# Patient Record
Sex: Female | Born: 1965 | Race: Black or African American | Hispanic: No | Smoking: Former smoker
Health system: Southern US, Community
[De-identification: ages and names within clinical notes are randomized; demographics above are authoritative.]

## PROBLEM LIST (undated history)

## (undated) DIAGNOSIS — F410 Panic disorder [episodic paroxysmal anxiety] without agoraphobia: Secondary | ICD-10-CM

## (undated) DIAGNOSIS — M25569 Pain in unspecified knee: Secondary | ICD-10-CM

## (undated) DIAGNOSIS — E669 Obesity, unspecified: Secondary | ICD-10-CM

## (undated) HISTORY — PX: PARTIAL HYSTERECTOMY: SHX80

## (undated) HISTORY — PX: SHOULDER SURGERY: SHX246

---

## 1999-03-08 ENCOUNTER — Emergency Department (HOSPITAL_COMMUNITY): Admission: EM | Admit: 1999-03-08 | Discharge: 1999-03-08 | Payer: Self-pay | Admitting: *Deleted

## 1999-03-08 ENCOUNTER — Encounter: Payer: Self-pay | Admitting: Emergency Medicine

## 1999-04-14 ENCOUNTER — Encounter: Payer: Self-pay | Admitting: Family Medicine

## 1999-04-14 ENCOUNTER — Ambulatory Visit (HOSPITAL_COMMUNITY): Admission: RE | Admit: 1999-04-14 | Discharge: 1999-04-14 | Payer: Self-pay

## 1999-11-13 ENCOUNTER — Emergency Department (HOSPITAL_COMMUNITY): Admission: EM | Admit: 1999-11-13 | Discharge: 1999-11-13 | Payer: Self-pay | Admitting: Emergency Medicine

## 1999-12-17 ENCOUNTER — Emergency Department (HOSPITAL_COMMUNITY): Admission: EM | Admit: 1999-12-17 | Discharge: 1999-12-17 | Payer: Self-pay | Admitting: Emergency Medicine

## 2001-02-12 ENCOUNTER — Emergency Department (HOSPITAL_COMMUNITY): Admission: EM | Admit: 2001-02-12 | Discharge: 2001-02-12 | Payer: Self-pay

## 2001-05-01 ENCOUNTER — Emergency Department (HOSPITAL_COMMUNITY): Admission: EM | Admit: 2001-05-01 | Discharge: 2001-05-01 | Payer: Self-pay | Admitting: Emergency Medicine

## 2001-05-09 ENCOUNTER — Emergency Department (HOSPITAL_COMMUNITY): Admission: EM | Admit: 2001-05-09 | Discharge: 2001-05-09 | Payer: Self-pay | Admitting: Emergency Medicine

## 2001-09-15 ENCOUNTER — Emergency Department (HOSPITAL_COMMUNITY): Admission: EM | Admit: 2001-09-15 | Discharge: 2001-09-15 | Payer: Self-pay | Admitting: Emergency Medicine

## 2001-09-15 ENCOUNTER — Encounter: Payer: Self-pay | Admitting: Emergency Medicine

## 2003-04-18 ENCOUNTER — Emergency Department (HOSPITAL_COMMUNITY): Admission: EM | Admit: 2003-04-18 | Discharge: 2003-04-18 | Payer: Self-pay | Admitting: Emergency Medicine

## 2003-06-15 ENCOUNTER — Emergency Department (HOSPITAL_COMMUNITY): Admission: EM | Admit: 2003-06-15 | Discharge: 2003-06-15 | Payer: Self-pay | Admitting: Emergency Medicine

## 2003-06-15 ENCOUNTER — Encounter: Payer: Self-pay | Admitting: Emergency Medicine

## 2004-10-24 ENCOUNTER — Emergency Department (HOSPITAL_COMMUNITY): Admission: EM | Admit: 2004-10-24 | Discharge: 2004-10-24 | Payer: Self-pay | Admitting: Emergency Medicine

## 2004-10-27 ENCOUNTER — Emergency Department (HOSPITAL_COMMUNITY): Admission: EM | Admit: 2004-10-27 | Discharge: 2004-10-27 | Payer: Self-pay | Admitting: Emergency Medicine

## 2006-01-01 ENCOUNTER — Emergency Department (HOSPITAL_COMMUNITY): Admission: EM | Admit: 2006-01-01 | Discharge: 2006-01-01 | Payer: Self-pay | Admitting: Emergency Medicine

## 2006-01-15 ENCOUNTER — Emergency Department (HOSPITAL_COMMUNITY): Admission: EM | Admit: 2006-01-15 | Discharge: 2006-01-15 | Payer: Self-pay | Admitting: Emergency Medicine

## 2006-05-07 ENCOUNTER — Emergency Department (HOSPITAL_COMMUNITY): Admission: EM | Admit: 2006-05-07 | Discharge: 2006-05-07 | Payer: Self-pay | Admitting: Emergency Medicine

## 2006-08-26 ENCOUNTER — Emergency Department (HOSPITAL_COMMUNITY): Admission: EM | Admit: 2006-08-26 | Discharge: 2006-08-26 | Payer: Self-pay | Admitting: Emergency Medicine

## 2006-09-07 ENCOUNTER — Emergency Department (HOSPITAL_COMMUNITY): Admission: EM | Admit: 2006-09-07 | Discharge: 2006-09-08 | Payer: Self-pay | Admitting: Emergency Medicine

## 2007-02-22 ENCOUNTER — Emergency Department (HOSPITAL_COMMUNITY): Admission: EM | Admit: 2007-02-22 | Discharge: 2007-02-22 | Payer: Self-pay | Admitting: Emergency Medicine

## 2008-05-08 ENCOUNTER — Emergency Department (HOSPITAL_COMMUNITY): Admission: EM | Admit: 2008-05-08 | Discharge: 2008-05-08 | Payer: Self-pay | Admitting: Emergency Medicine

## 2008-06-05 ENCOUNTER — Emergency Department (HOSPITAL_COMMUNITY): Admission: EM | Admit: 2008-06-05 | Discharge: 2008-06-05 | Payer: Self-pay | Admitting: Emergency Medicine

## 2008-06-08 ENCOUNTER — Emergency Department (HOSPITAL_COMMUNITY): Admission: EM | Admit: 2008-06-08 | Discharge: 2008-06-08 | Payer: Self-pay | Admitting: Emergency Medicine

## 2008-06-10 ENCOUNTER — Emergency Department (HOSPITAL_COMMUNITY): Admission: EM | Admit: 2008-06-10 | Discharge: 2008-06-10 | Payer: Self-pay | Admitting: Emergency Medicine

## 2010-05-03 ENCOUNTER — Emergency Department (HOSPITAL_COMMUNITY): Admission: EM | Admit: 2010-05-03 | Discharge: 2010-05-03 | Payer: Self-pay | Admitting: Emergency Medicine

## 2010-11-13 LAB — CBC
HCT: 36.9 % (ref 36.0–46.0)
MCH: 29.9 pg (ref 26.0–34.0)
MCV: 88.2 fL (ref 78.0–100.0)
RBC: 4.18 MIL/uL (ref 3.87–5.11)
RDW: 13.8 % (ref 11.5–15.5)

## 2010-11-13 LAB — BASIC METABOLIC PANEL
BUN: 10 mg/dL (ref 6–23)
CO2: 26 mEq/L (ref 19–32)
Chloride: 106 mEq/L (ref 96–112)
Creatinine, Ser: 0.7 mg/dL (ref 0.4–1.2)
Potassium: 3.6 mEq/L (ref 3.5–5.1)

## 2010-11-13 LAB — D-DIMER, QUANTITATIVE: D-Dimer, Quant: 0.29 ug/mL-FEU (ref 0.00–0.48)

## 2010-11-13 LAB — DIFFERENTIAL
Basophils Absolute: 0 10*3/uL (ref 0.0–0.1)
Eosinophils Absolute: 0 10*3/uL (ref 0.0–0.7)
Monocytes Absolute: 0.3 10*3/uL (ref 0.1–1.0)
Monocytes Relative: 4 % (ref 3–12)

## 2010-11-13 LAB — POCT CARDIAC MARKERS

## 2011-01-05 ENCOUNTER — Emergency Department (HOSPITAL_COMMUNITY)
Admission: EM | Admit: 2011-01-05 | Discharge: 2011-01-05 | Disposition: A | Discharge status: Home / Self Care | Payer: Self-pay | Attending: Emergency Medicine | Admitting: Emergency Medicine

## 2011-01-05 ENCOUNTER — Emergency Department (HOSPITAL_COMMUNITY): Payer: Self-pay

## 2011-01-05 DIAGNOSIS — M25569 Pain in unspecified knee: Secondary | ICD-10-CM | POA: Insufficient documentation

## 2011-06-25 ENCOUNTER — Emergency Department (HOSPITAL_COMMUNITY)
Admission: EM | Admit: 2011-06-25 | Discharge: 2011-06-25 | Disposition: A | Discharge status: Home / Self Care | Payer: Self-pay | Attending: Emergency Medicine | Admitting: Emergency Medicine

## 2011-06-25 ENCOUNTER — Emergency Department (HOSPITAL_COMMUNITY): Payer: Self-pay

## 2011-06-25 DIAGNOSIS — Z87891 Personal history of nicotine dependence: Secondary | ICD-10-CM | POA: Insufficient documentation

## 2011-06-25 DIAGNOSIS — R070 Pain in throat: Secondary | ICD-10-CM | POA: Insufficient documentation

## 2011-06-25 DIAGNOSIS — J3489 Other specified disorders of nose and nasal sinuses: Secondary | ICD-10-CM | POA: Insufficient documentation

## 2011-06-25 DIAGNOSIS — R05 Cough: Secondary | ICD-10-CM | POA: Insufficient documentation

## 2011-06-25 DIAGNOSIS — B9789 Other viral agents as the cause of diseases classified elsewhere: Secondary | ICD-10-CM | POA: Insufficient documentation

## 2011-06-25 DIAGNOSIS — IMO0001 Reserved for inherently not codable concepts without codable children: Secondary | ICD-10-CM | POA: Insufficient documentation

## 2011-06-25 DIAGNOSIS — J069 Acute upper respiratory infection, unspecified: Secondary | ICD-10-CM | POA: Insufficient documentation

## 2011-06-25 DIAGNOSIS — R059 Cough, unspecified: Secondary | ICD-10-CM | POA: Insufficient documentation

## 2012-05-06 ENCOUNTER — Encounter (HOSPITAL_COMMUNITY): Payer: Self-pay

## 2012-05-06 ENCOUNTER — Emergency Department (HOSPITAL_COMMUNITY)
Admission: EM | Admit: 2012-05-06 | Discharge: 2012-05-06 | Disposition: A | Discharge status: Home / Self Care | Payer: Self-pay | Attending: Emergency Medicine | Admitting: Emergency Medicine

## 2012-05-06 DIAGNOSIS — L02219 Cutaneous abscess of trunk, unspecified: Secondary | ICD-10-CM | POA: Insufficient documentation

## 2012-05-06 DIAGNOSIS — L02212 Cutaneous abscess of back [any part, except buttock]: Secondary | ICD-10-CM

## 2012-05-06 DIAGNOSIS — F410 Panic disorder [episodic paroxysmal anxiety] without agoraphobia: Secondary | ICD-10-CM | POA: Insufficient documentation

## 2012-05-06 DIAGNOSIS — Z87891 Personal history of nicotine dependence: Secondary | ICD-10-CM | POA: Insufficient documentation

## 2012-05-06 HISTORY — DX: Obesity, unspecified: E66.9

## 2012-05-06 HISTORY — DX: Panic disorder (episodic paroxysmal anxiety): F41.0

## 2012-05-06 MED ORDER — SULFAMETHOXAZOLE-TRIMETHOPRIM 800-160 MG PO TABS: 1.0000 | ORAL_TABLET | Freq: Two times a day (BID) | ORAL | Status: AC

## 2012-05-06 MED ORDER — TRAMADOL-ACETAMINOPHEN 37.5-325 MG PO TABS: ORAL_TABLET | ORAL | Status: AC

## 2012-05-06 NOTE — ED Provider Notes (Cosign Needed)
History     CSN: [id number]  Arrival date & time 05/06/12  0820   First MD Initiated Contact with Patient 05/06/12 0930      Chief Complaint  Patient presents with  . Abscess    recurring.    (Consider location/radiation/quality/duration/timing/severity/associated sxs/prior treatment) HPI  Pt relates she has had an abscess in the same place about 3-4 times in the past, the last time was 1 year ago. States it is aching and started draining yesterday. Denies fever.   PCP none  Past Medical History  Diagnosis Date  . Obesity   . Panic attacks     Past Surgical History  Procedure Date  . Partial hysterectomy     Family History  Problem Relation Age of Onset  . Osteoarthritis Mother   . Hypertension Father   . Hypertension Sister     History  Substance Use Topics  . Smoking status: Former Games developer  . Smokeless tobacco: Never Used  . Alcohol Use: No  employed Lives alone  OB History    Grav Para Term Preterm Abortions TAB SAB Ect Mult Living                  Review of Systems  All other systems reviewed and are negative.    Allergies  Ibuprofen  Home Medications   Current Outpatient Rx  Name Route Sig Dispense Refill  . ASPIRIN 81 MG PO CHEW Oral Chew 162 mg by mouth daily as needed. For pain.      BP 145/72  Pulse 75  Temp 98.6 F (37 C) (Oral)  Resp 18  SpO2 100%  Vital signs normal    Physical Exam  Vitals reviewed. Constitutional: She is oriented to person, place, and time. She appears well-developed and well-nourished.  Non-toxic appearance. She does not appear ill. No distress.  HENT:  Head: Normocephalic and atraumatic.  Nose: Nose normal. No mucosal edema or rhinorrhea.  Mouth/Throat: Mucous membranes are normal. No dental abscesses or uvula swelling.  Eyes: Conjunctivae and EOM are normal. Pupils are equal, round, and reactive to light.  Neck: Normal range of motion and full passive range of motion without pain. Neck supple.    Pulmonary/Chest: Effort normal. No respiratory distress. She has no rhonchi. She exhibits no crepitus.  Abdominal: Normal appearance.  Musculoskeletal: Normal range of motion. She exhibits no edema and no tenderness.       Moves all extremities well.   Neurological: She is alert and oriented to person, place, and time. She has normal strength. No cranial nerve deficit.  Skin: Skin is warm, dry and intact. No rash noted. No erythema. No pallor.       Pt has a darkened area that is oval and about 2 cm in horizontal length with an area in the center that look like granulation tissue. No induration under the area, when palpated a small amount of purulent material is expressed. No erythema or warmth noted.  Psychiatric: She has a normal mood and affect. Her speech is normal and behavior is normal. Her mood appears not anxious.    ED Course  Procedures (including critical care time)  The abscess does not need I and D at this time, no induration under the area and it is already spontaneously draining. I wonder if she has a cyst that needs to be removed since this is about the 4th episode of similar symptoms in same place. Will advise to see dermatologist once better to see if she needs  a cystectomy done.     1. Abscess of back     New Prescriptions   SULFAMETHOXAZOLE-TRIMETHOPRIM (SEPTRA DS) 800-160 MG PER TABLET    Take 1 tablet by mouth 2 (two) times daily.   TRAMADOL-ACETAMINOPHEN (ULTRACET) 37.5-325 MG PER TABLET    2 tabs po QID prn pain    Plan discharge  Devoria Albe, MD, Armando Gang   MDM          Ward Givens, MD 05/06/12 1012

## 2012-05-06 NOTE — ED Notes (Signed)
Pataient reports that abscess to left mid back started 4 years ago and continues to reoccur. Patient reports that she has had 4 incision and drainage. Area currently draining a small amount of serosangious drainage.

## 2012-05-21 ENCOUNTER — Encounter (HOSPITAL_COMMUNITY): Payer: Self-pay | Admitting: Emergency Medicine

## 2012-05-21 ENCOUNTER — Emergency Department (HOSPITAL_COMMUNITY)
Admission: EM | Admit: 2012-05-21 | Discharge: 2012-05-21 | Disposition: A | Discharge status: Home / Self Care | Payer: No Typology Code available for payment source | Attending: Emergency Medicine | Admitting: Emergency Medicine

## 2012-05-21 DIAGNOSIS — S139XXA Sprain of joints and ligaments of unspecified parts of neck, initial encounter: Secondary | ICD-10-CM | POA: Insufficient documentation

## 2012-05-21 DIAGNOSIS — T148XXA Other injury of unspecified body region, initial encounter: Secondary | ICD-10-CM

## 2012-05-21 DIAGNOSIS — Y93I9 Activity, other involving external motion: Secondary | ICD-10-CM | POA: Insufficient documentation

## 2012-05-21 DIAGNOSIS — Y998 Other external cause status: Secondary | ICD-10-CM | POA: Insufficient documentation

## 2012-05-21 DIAGNOSIS — Z87891 Personal history of nicotine dependence: Secondary | ICD-10-CM | POA: Insufficient documentation

## 2012-05-21 DIAGNOSIS — S161XXA Strain of muscle, fascia and tendon at neck level, initial encounter: Secondary | ICD-10-CM

## 2012-05-21 DIAGNOSIS — E669 Obesity, unspecified: Secondary | ICD-10-CM | POA: Insufficient documentation

## 2012-05-21 DIAGNOSIS — T07XXXA Unspecified multiple injuries, initial encounter: Secondary | ICD-10-CM | POA: Insufficient documentation

## 2012-05-21 MED ORDER — CYCLOBENZAPRINE HCL 10 MG PO TABS: 10.0000 mg | ORAL_TABLET | Freq: Two times a day (BID) | ORAL | Status: DC | PRN

## 2012-05-21 MED ORDER — ACETAMINOPHEN 325 MG PO TABS: 650.0000 mg | ORAL_TABLET | Freq: Four times a day (QID) | ORAL | Status: DC | PRN

## 2012-05-21 NOTE — ED Provider Notes (Signed)
Medical screening examination/treatment/procedure(s) were performed by non-physician practitioner and as supervising physician I was immediately available for consultation/collaboration.    Celene Kras, MD 05/21/12 [phone]
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
## 2013-03-22 ENCOUNTER — Emergency Department (HOSPITAL_COMMUNITY)
Admission: EM | Admit: 2013-03-22 | Discharge: 2013-03-22 | Disposition: A | Discharge status: Home / Self Care | Payer: No Typology Code available for payment source | Attending: Emergency Medicine | Admitting: Emergency Medicine

## 2013-03-22 ENCOUNTER — Encounter (HOSPITAL_COMMUNITY): Payer: Self-pay | Admitting: *Deleted

## 2013-03-22 DIAGNOSIS — Z87891 Personal history of nicotine dependence: Secondary | ICD-10-CM | POA: Insufficient documentation

## 2013-03-22 DIAGNOSIS — L02212 Cutaneous abscess of back [any part, except buttock]: Secondary | ICD-10-CM

## 2013-03-22 DIAGNOSIS — W57XXXA Bitten or stung by nonvenomous insect and other nonvenomous arthropods, initial encounter: Secondary | ICD-10-CM | POA: Insufficient documentation

## 2013-03-22 DIAGNOSIS — Z8659 Personal history of other mental and behavioral disorders: Secondary | ICD-10-CM | POA: Insufficient documentation

## 2013-03-22 DIAGNOSIS — L089 Local infection of the skin and subcutaneous tissue, unspecified: Secondary | ICD-10-CM | POA: Insufficient documentation

## 2013-03-22 DIAGNOSIS — L02219 Cutaneous abscess of trunk, unspecified: Secondary | ICD-10-CM | POA: Insufficient documentation

## 2013-03-22 DIAGNOSIS — Y929 Unspecified place or not applicable: Secondary | ICD-10-CM | POA: Insufficient documentation

## 2013-03-22 DIAGNOSIS — Y9389 Activity, other specified: Secondary | ICD-10-CM | POA: Insufficient documentation

## 2013-03-22 DIAGNOSIS — E669 Obesity, unspecified: Secondary | ICD-10-CM | POA: Insufficient documentation

## 2013-03-22 MED ORDER — MUPIROCIN CALCIUM 2 % EX CREA: TOPICAL_CREAM | Freq: Three times a day (TID) | CUTANEOUS | Status: DC

## 2013-03-22 MED ORDER — HYDROCODONE-ACETAMINOPHEN 5-325 MG PO TABS: 1.0000 | ORAL_TABLET | Freq: Four times a day (QID) | ORAL | Status: DC | PRN

## 2013-03-22 MED ORDER — CEPHALEXIN 500 MG PO CAPS: 500.0000 mg | ORAL_CAPSULE | Freq: Four times a day (QID) | ORAL | Status: DC

## 2013-03-22 NOTE — ED Notes (Signed)
Pt states 4 years ago she got bit by something on her upper back, states place keeps coming back and recently it has been bleeding everyday, pt states painful when anything touches the spot.

## 2013-03-22 NOTE — ED Provider Notes (Signed)
   History    CSN: [id number] Arrival date & time 03/22/13  1252  First MD Initiated Contact with Patient 03/22/13 1337     Chief Complaint  Patient presents with  . bleeding insect bite    (Consider location/radiation/quality/duration/timing/severity/associated sxs/prior Treatment) HPI Sara Arias is a 47 y.o. female who presents to ED with complaint of an abscess to the left upper back that is erythematous, recurrent for several years. States had to have it I&Ded several times. States this time pain for two days, started to drain and bleed at work over night. Pt denies fever, chills, malaise. Has not tried any medications. Has not seen anyone for it this time. Pain worsened with palpation and movement of the left arm.    Past Medical History  Diagnosis Date  . Obesity   . Panic attacks    Past Surgical History  Procedure Laterality Date  . Partial hysterectomy     Family History  Problem Relation Age of Onset  . Osteoarthritis Mother   . Hypertension Father   . Hypertension Sister    History  Substance Use Topics  . Smoking status: Former Games developer  . Smokeless tobacco: Never Used  . Alcohol Use: No   OB History   Grav Para Term Preterm Abortions TAB SAB Ect Mult Living                 Review of Systems  Constitutional: Negative for fever and chills.  HENT: Negative for neck pain and neck stiffness.   Respiratory: Negative.   Cardiovascular: Negative.   Genitourinary: Negative for dysuria and flank pain.  Skin: Positive for wound.  Neurological: Negative for weakness and numbness.    Allergies  Ibuprofen  Home Medications  No current outpatient prescriptions on file. BP 122/83  Pulse 83  Temp(Src) 97.7 F (36.5 C) (Oral)  Resp 18  SpO2 100% Physical Exam  Nursing note and vitals reviewed. Constitutional: She appears well-developed and well-nourished. No distress.  Cardiovascular: Normal rate, regular rhythm and normal heart sounds.   Pulmonary/Chest:  Effort normal. No respiratory distress. She has no wheezes. She has no rales.  Neurological: She is alert.  Skin: Skin is warm and dry.  There is opened abscess to the left scapular area. No drainage noted. No fluctuance, no swelling, no surrounding cellulitis. No bleeding. TTP.   Psychiatric: She has a normal mood and affect. Her behavior is normal.    ED Course  Procedures (including critical care time) Labs Reviewed - No data to display No results found.  1. Abscess of back     MDM  Pt with already opened abscess, draining on its own. No drainage or bleeding at present. There is no induration of fluctuance. I do not feel that there is an abscess to drain at this time. No surrounding cellulitis. Will treat with antibiotics. Follow up with pcp.   Filed Vitals:   03/22/13 1304  BP: 122/83  Pulse: 83  Temp: 97.7 F (36.5 C)  TempSrc: Oral  Resp: 18  SpO2: 100%     Myriam Jacobson Latana Colin, PA-C 03/22/13 1618

## 2013-03-23 NOTE — ED Provider Notes (Signed)
Medical screening examination/treatment/procedure(s) were performed by non-physician practitioner and as supervising physician I was immediately available for consultation/collaboration.   Lyanne Co, MD 03/23/13 1323

## 2013-05-12 ENCOUNTER — Emergency Department (HOSPITAL_COMMUNITY)
Admission: EM | Admit: 2013-05-12 | Discharge: 2013-05-12 | Disposition: A | Discharge status: Home / Self Care | Payer: No Typology Code available for payment source | Attending: Emergency Medicine | Admitting: Emergency Medicine

## 2013-05-12 ENCOUNTER — Emergency Department (HOSPITAL_COMMUNITY): Payer: No Typology Code available for payment source

## 2013-05-12 ENCOUNTER — Encounter (HOSPITAL_COMMUNITY): Payer: Self-pay | Admitting: Emergency Medicine

## 2013-05-12 DIAGNOSIS — Z8659 Personal history of other mental and behavioral disorders: Secondary | ICD-10-CM | POA: Insufficient documentation

## 2013-05-12 DIAGNOSIS — Z7982 Long term (current) use of aspirin: Secondary | ICD-10-CM | POA: Insufficient documentation

## 2013-05-12 DIAGNOSIS — R042 Hemoptysis: Secondary | ICD-10-CM | POA: Insufficient documentation

## 2013-05-12 DIAGNOSIS — R4789 Other speech disturbances: Secondary | ICD-10-CM | POA: Insufficient documentation

## 2013-05-12 DIAGNOSIS — R079 Chest pain, unspecified: Secondary | ICD-10-CM | POA: Insufficient documentation

## 2013-05-12 DIAGNOSIS — R05 Cough: Secondary | ICD-10-CM | POA: Insufficient documentation

## 2013-05-12 DIAGNOSIS — R059 Cough, unspecified: Secondary | ICD-10-CM | POA: Insufficient documentation

## 2013-05-12 DIAGNOSIS — Z87891 Personal history of nicotine dependence: Secondary | ICD-10-CM | POA: Insufficient documentation

## 2013-05-12 DIAGNOSIS — E669 Obesity, unspecified: Secondary | ICD-10-CM | POA: Insufficient documentation

## 2013-05-12 DIAGNOSIS — R509 Fever, unspecified: Secondary | ICD-10-CM | POA: Insufficient documentation

## 2013-05-12 DIAGNOSIS — J029 Acute pharyngitis, unspecified: Secondary | ICD-10-CM | POA: Insufficient documentation

## 2013-05-12 DIAGNOSIS — R112 Nausea with vomiting, unspecified: Secondary | ICD-10-CM | POA: Insufficient documentation

## 2013-05-12 DIAGNOSIS — R61 Generalized hyperhidrosis: Secondary | ICD-10-CM | POA: Insufficient documentation

## 2013-05-12 DIAGNOSIS — J3489 Other specified disorders of nose and nasal sinuses: Secondary | ICD-10-CM | POA: Insufficient documentation

## 2013-05-12 DIAGNOSIS — B9789 Other viral agents as the cause of diseases classified elsewhere: Secondary | ICD-10-CM | POA: Insufficient documentation

## 2013-05-12 DIAGNOSIS — B349 Viral infection, unspecified: Secondary | ICD-10-CM

## 2013-05-12 LAB — RAPID STREP SCREEN (MED CTR MEBANE ONLY): Streptococcus, Group A Screen (Direct): NEGATIVE

## 2013-05-12 MED ORDER — MAGIC MOUTHWASH W/LIDOCAINE: 10.0000 mL | Freq: Three times a day (TID) | ORAL | Status: DC | PRN

## 2013-05-12 NOTE — ED Notes (Signed)
Pt states that she has had sore throat, hoarse voice and cough.  Pt states that this morning she coughed up mucous there was bright red blood in it. She has also had chills the past couple of days.

## 2013-05-12 NOTE — ED Provider Notes (Signed)
CSN: [id number]     Arrival date & time 05/12/13  1128 History   First MD Initiated Contact with Patient 05/12/13 1136     Chief Complaint  Patient presents with  . Sore Throat  . Hoarse  . Hemoptysis   (Consider location/radiation/quality/duration/timing/severity/associated sxs/prior Treatment) HPI Pt is a 47yo female c/o gradually worsening sore throat, 7/10, worse with swallowing and coughing.  This morning she had 3 episodes of post-tussive emesis that continued mucous and "small amount" of bright red blood.  Pt also reports hot and cold chills, subjective fever.  States she does have a sick grandson at home and not sure if she is sick because of him.  Has tried alka-seltzer and vix vapor rub with minimal relief. Denies difficulty swallowing or breathing. Denies hx of asthma.  Past Medical History  Diagnosis Date  . Obesity   . Panic attacks    Past Surgical History  Procedure Laterality Date  . Partial hysterectomy     Family History  Problem Relation Age of Onset  . Osteoarthritis Mother   . Hypertension Father   . Hypertension Sister    History  Substance Use Topics  . Smoking status: Former Games developer  . Smokeless tobacco: Never Used  . Alcohol Use: No   OB History   Grav Para Term Preterm Abortions TAB SAB Ect Mult Living                 Review of Systems  Constitutional: Positive for fever ( subjective), chills and diaphoresis. Negative for appetite change and fatigue.  HENT: Positive for congestion, sore throat and voice change ( "hoarse voice"). Negative for mouth sores, trouble swallowing and sinus pressure.   Respiratory: Positive for cough. Negative for shortness of breath.   Cardiovascular: Positive for chest pain. Negative for palpitations and leg swelling.  Gastrointestinal: Positive for nausea and vomiting. Negative for abdominal pain, diarrhea and constipation.  All other systems reviewed and are negative.    Allergies  Ibuprofen  Home Medications    Current Outpatient Rx  Name  Route  Sig  Dispense  Refill  . aspirin 81 MG chewable tablet   Oral   Chew 81 mg by mouth 3 (three) times daily.         . Alum & Mag Hydroxide-Simeth (MAGIC MOUTHWASH W/LIDOCAINE) SOLN   Oral   Take 10 mLs by mouth 3 (three) times daily as needed.   100 mL   0    BP 130/97  Pulse 86  Temp(Src) 98.4 F (36.9 C) (Oral)  Resp 18  SpO2 99% Physical Exam  Nursing note and vitals reviewed. Constitutional: She appears well-developed and well-nourished. No distress.  Pt sitting in exam bed, NAD.   HENT:  Head: Normocephalic and atraumatic.  Right Ear: Hearing, tympanic membrane, external ear and ear canal normal.  Left Ear: Hearing, tympanic membrane, external ear and ear canal normal.  Nose: Mucosal edema present. No rhinorrhea.  Mouth/Throat: Uvula is midline and mucous membranes are normal. She does not have dentures. No oral lesions. No trismus in the jaw. Normal dentition. No dental abscesses, edematous, lacerations or dental caries. Posterior oropharyngeal edema and posterior oropharyngeal erythema present. No oropharyngeal exudate or tonsillar abscesses.  Eyes: Conjunctivae are normal. No scleral icterus.  Neck: Normal range of motion. Neck supple.  No nuchal rigidity or meningeal signs.   Cardiovascular: Normal rate, regular rhythm and normal heart sounds.   Pulmonary/Chest: Effort normal and breath sounds normal. No respiratory distress. She has  no wheezes. She has no rales. She exhibits no tenderness.  Lungs: CTAB. No respiratory distress, able to speak in full sentences w/o difficulty.  Abdominal: Soft. Bowel sounds are normal. She exhibits no distension and no mass. There is no tenderness. There is no rebound and no guarding.  Musculoskeletal: Normal range of motion.  Neurological: She is alert.  Skin: Skin is warm and dry. She is not diaphoretic.    ED Course  Procedures (including critical care time) Labs Review Labs Reviewed   RAPID STREP SCREEN  CULTURE, GROUP A STREP   Imaging Review Dg Chest 2 View  05/12/2013   CLINICAL DATA:  Short of breath  EXAM: CHEST  2 VIEW  COMPARISON:  06/25/2011  FINDINGS: The heart size and mediastinal contours are within normal limits. Both lungs are clear. The visualized skeletal structures are unremarkable.  IMPRESSION: No active cardiopulmonary disease.   Electronically Signed   By: Amie Portland   On: 05/12/2013 12:46    MDM   1. Pharyngitis with viral syndrome    Pt appears well, non-toxic, no respiratory distress. Vitals: unremarkable.  Rapid strep: negative. CXR: no active cardiopulmonary disease.  Will tx pt symptomatically for viral pharyngitis.  Rx: magic mouthwash.  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  Provided work note.  Will discharge pt home and have her f/u with [facility] and [facility] info provided. Return precautions given. Pt verbalized understanding and agreement with tx plan. Vitals: unremarkable. Discharged in stable condition.         Junius Finner, PA-C 05/12/13 1304

## 2013-05-13 NOTE — ED Provider Notes (Signed)
Medical screening examination/treatment/procedure(s) were performed by non-physician practitioner and as supervising physician I was immediately available for consultation/collaboration.   Shanna Cisco, MD 05/13/13 [phone]

## 2013-05-14 LAB — CULTURE, GROUP A STREP

## 2013-06-20 ENCOUNTER — Emergency Department (HOSPITAL_COMMUNITY)
Admission: EM | Admit: 2013-06-20 | Discharge: 2013-06-20 | Disposition: A | Discharge status: Home / Self Care | Payer: No Typology Code available for payment source | Attending: Emergency Medicine | Admitting: Emergency Medicine

## 2013-06-20 ENCOUNTER — Encounter (HOSPITAL_COMMUNITY): Payer: Self-pay | Admitting: Emergency Medicine

## 2013-06-20 ENCOUNTER — Emergency Department (HOSPITAL_COMMUNITY): Payer: No Typology Code available for payment source

## 2013-06-20 DIAGNOSIS — X500XXA Overexertion from strenuous movement or load, initial encounter: Secondary | ICD-10-CM | POA: Insufficient documentation

## 2013-06-20 DIAGNOSIS — Z87891 Personal history of nicotine dependence: Secondary | ICD-10-CM | POA: Insufficient documentation

## 2013-06-20 DIAGNOSIS — IMO0002 Reserved for concepts with insufficient information to code with codable children: Secondary | ICD-10-CM | POA: Insufficient documentation

## 2013-06-20 DIAGNOSIS — E669 Obesity, unspecified: Secondary | ICD-10-CM | POA: Insufficient documentation

## 2013-06-20 DIAGNOSIS — M549 Dorsalgia, unspecified: Secondary | ICD-10-CM

## 2013-06-20 DIAGNOSIS — Y9389 Activity, other specified: Secondary | ICD-10-CM | POA: Insufficient documentation

## 2013-06-20 DIAGNOSIS — Z7982 Long term (current) use of aspirin: Secondary | ICD-10-CM | POA: Insufficient documentation

## 2013-06-20 DIAGNOSIS — Y9210 Unspecified residential institution as the place of occurrence of the external cause: Secondary | ICD-10-CM | POA: Insufficient documentation

## 2013-06-20 DIAGNOSIS — S46911A Strain of unspecified muscle, fascia and tendon at shoulder and upper arm level, right arm, initial encounter: Secondary | ICD-10-CM

## 2013-06-20 DIAGNOSIS — Y990 Civilian activity done for income or pay: Secondary | ICD-10-CM | POA: Insufficient documentation

## 2013-06-20 DIAGNOSIS — Z8659 Personal history of other mental and behavioral disorders: Secondary | ICD-10-CM | POA: Insufficient documentation

## 2013-06-20 MED ORDER — PREDNISONE 50 MG PO TABS: 50.0000 mg | ORAL_TABLET | Freq: Every day | ORAL | Status: DC

## 2013-06-20 MED ORDER — HYDROCODONE-ACETAMINOPHEN 5-325 MG PO TABS: 1.0000 | ORAL_TABLET | Freq: Four times a day (QID) | ORAL | Status: DC | PRN

## 2013-06-20 MED ORDER — CYCLOBENZAPRINE HCL 10 MG PO TABS: 10.0000 mg | ORAL_TABLET | Freq: Three times a day (TID) | ORAL | Status: DC | PRN

## 2013-06-20 NOTE — ED Notes (Signed)
Pt c/o of right shoulder pain and back pain 10/10. 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

## 2013-06-20 NOTE — ED Provider Notes (Signed)
CSN: [id number]     Arrival date & time 06/20/13  1610 History   First MD Initiated Contact with Patient 06/20/13 1635     Chief Complaint  Patient presents with  . Back Pain  . Shoulder Pain   (Consider location/radiation/quality/duration/timing/severity/associated sxs/prior Treatment) HPI  Sara Arias is a 47 y.o. 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 She has been taking Aspirin, muscle relaxants, and pain patch from the grocery store that have not resolved her pain. She is unable to do her work or do as much as before the accident. She feels tingling in her elbow and down her arm at times. Her spine also has burning feeling at times. No dizziness, headaches, blurred vision. No weakness or numbness in her legs. No bowel or bladder incontinence.    Past Medical History  Diagnosis Date  . Obesity   . Panic attacks    Past Surgical History  Procedure Laterality Date  . Partial hysterectomy     Family History  Problem Relation Age of Onset  . Osteoarthritis Mother   . Hypertension Father   . Hypertension Sister    History  Substance Use Topics  . Smoking status: Former Games developer  . Smokeless tobacco: Never Used  . Alcohol Use: No   OB History   Grav Para Term Preterm Abortions TAB SAB Ect Mult Living                 Review of Systems  Allergies  Ibuprofen  Home Medications   Current Outpatient Rx  Name  Route  Sig  Dispense  Refill  . aspirin (BAYER ASPIRIN) 325 MG tablet   Oral   Take 325 mg by mouth daily.         . Menthol-Methyl Salicylate (MUSCLE RUB) 10-15 % CREA   Topical   Apply 1 application topically as needed (for shulder pain).          BP 117/80  Pulse 97  Temp(Src) 98.9 F (37.2 C) (Oral)  Resp 18  SpO2 100% Physical Exam  Constitutional: She is oriented to person, place, and time. She appears well-developed and well-nourished.  HENT:  Head:  Normocephalic and atraumatic.  Neck: Normal range of motion. Spinous process tenderness and muscular tenderness (on right side) present.  Cardiovascular: Normal rate, regular rhythm, S1 normal and S2 normal.   Pulmonary/Chest: Effort normal and breath sounds normal.  Musculoskeletal:       Right shoulder: She exhibits decreased range of motion, tenderness, bony tenderness and decreased strength. She exhibits no swelling, no crepitus and no deformity.       Left shoulder: Normal.       Right elbow: She exhibits normal range of motion (pain with pronation). Tenderness found. Lateral epicondyle tenderness noted.       Left elbow: Normal.       Right wrist: Normal. She exhibits normal range of motion, no tenderness and no bony tenderness.       Left wrist: Normal.       Cervical back: She exhibits tenderness and bony tenderness. She exhibits normal range of motion.       Thoracic back: She exhibits decreased range of motion, tenderness and bony tenderness. She exhibits no swelling.       Lumbar back: She exhibits decreased range of motion, tenderness and bony tenderness. She exhibits no swelling and no deformity.  Neurological: She is alert  and oriented to person, place, and time. She has normal strength and normal reflexes. She exhibits normal muscle tone. Coordination and gait normal.    ED Course  Procedures (including critical care time)  Patient has no neurological deficits noted on exam.  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  Patient be given orthopedic followup.  She told to return here as needed.  Told to use ice and heat on her shoulder and back  Carlyle Dolly, PA-C 06/20/13 1737

## 2013-06-20 NOTE — ED Provider Notes (Signed)
Medical screening examination/treatment/procedure(s) were performed by non-physician practitioner and as supervising physician I was immediately available for consultation/collaboration.  Shanna Cisco, MD 06/20/13 [phone]

## 2014-05-12 ENCOUNTER — Emergency Department (HOSPITAL_COMMUNITY): Payer: No Typology Code available for payment source

## 2014-05-12 ENCOUNTER — Emergency Department (HOSPITAL_COMMUNITY)
Admission: EM | Admit: 2014-05-12 | Discharge: 2014-05-12 | Disposition: A | Discharge status: Home / Self Care | Payer: No Typology Code available for payment source | Attending: Emergency Medicine | Admitting: Emergency Medicine

## 2014-05-12 ENCOUNTER — Encounter (HOSPITAL_COMMUNITY): Payer: Self-pay | Admitting: Emergency Medicine

## 2014-05-12 DIAGNOSIS — Y9389 Activity, other specified: Secondary | ICD-10-CM | POA: Insufficient documentation

## 2014-05-12 DIAGNOSIS — Y9289 Other specified places as the place of occurrence of the external cause: Secondary | ICD-10-CM | POA: Insufficient documentation

## 2014-05-12 DIAGNOSIS — S6980XA Other specified injuries of unspecified wrist, hand and finger(s), initial encounter: Secondary | ICD-10-CM | POA: Insufficient documentation

## 2014-05-12 DIAGNOSIS — E669 Obesity, unspecified: Secondary | ICD-10-CM | POA: Insufficient documentation

## 2014-05-12 DIAGNOSIS — S6000XA Contusion of unspecified finger without damage to nail, initial encounter: Secondary | ICD-10-CM | POA: Insufficient documentation

## 2014-05-12 DIAGNOSIS — X58XXXA Exposure to other specified factors, initial encounter: Secondary | ICD-10-CM | POA: Insufficient documentation

## 2014-05-12 DIAGNOSIS — Z8659 Personal history of other mental and behavioral disorders: Secondary | ICD-10-CM | POA: Insufficient documentation

## 2014-05-12 DIAGNOSIS — S6990XA Unspecified injury of unspecified wrist, hand and finger(s), initial encounter: Secondary | ICD-10-CM | POA: Insufficient documentation

## 2014-05-12 DIAGNOSIS — Z7982 Long term (current) use of aspirin: Secondary | ICD-10-CM | POA: Insufficient documentation

## 2014-05-12 DIAGNOSIS — Z87891 Personal history of nicotine dependence: Secondary | ICD-10-CM | POA: Insufficient documentation

## 2014-05-12 DIAGNOSIS — Z79899 Other long term (current) drug therapy: Secondary | ICD-10-CM | POA: Insufficient documentation

## 2014-05-12 DIAGNOSIS — S60021A Contusion of right index finger without damage to nail, initial encounter: Secondary | ICD-10-CM

## 2014-05-12 MED ORDER — TRAMADOL HCL 50 MG PO TABS
50.0000 mg | ORAL_TABLET | Freq: Once | ORAL | Status: AC
  Administered 2014-05-12: 50 mg via ORAL
  Filled 2014-05-12: qty 1

## 2014-05-12 MED ORDER — TRAMADOL HCL 50 MG PO TABS: 50.0000 mg | ORAL_TABLET | Freq: Four times a day (QID) | ORAL | Status: DC | PRN

## 2014-05-12 NOTE — ED Provider Notes (Signed)
CSN: [id number]     Arrival date & time 05/12/14  1247 History  This chart was scribed for non-physician practitioner, Francee Piccolo, PA-C working with Toy Cookey, MD by Greggory Stallion, ED scribe. This patient was seen in room WTR6/WTR6 and the patient's care was started at 2:13 PM.   Chief Complaint  Patient presents with  . Finger Injury   The history is provided by the patient. No language interpreter was used.   HPI Comments: YILIN WEEDON is a 48 y.o. female who presents to the Emergency Department complaining of right index finger injury that occurred one week ago. 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 Reports worsening right index finger pain with associated swelling. Pain does not radiate. Pt has taken bayer aspirin and used muscle rub with no relief. Denies past injury or surgery to right hand or fingers. Pt is left hand dominant.   Past Medical History  Diagnosis Date  . Obesity   . Panic attacks    Past Surgical History  Procedure Laterality Date  . Partial hysterectomy     Family History  Problem Relation Age of Onset  . Osteoarthritis Mother   . Hypertension Father   . Hypertension Sister    History  Substance Use Topics  . Smoking status: Former Smoker    Types: Cigarettes    Quit date: 05/01/2013  . Smokeless tobacco: Never Used  . Alcohol Use: No   OB History   Grav Para Term Preterm Abortions TAB SAB Ect Mult Living                 Review of Systems  Musculoskeletal: Positive for arthralgias and joint swelling.  All other systems reviewed and are negative.  Allergies  Ibuprofen  Home Medications   Prior to Admission medications   Medication Sig Start Date End Date Taking? Authorizing Provider  aspirin (BAYER ASPIRIN) 325 MG tablet Take 325 mg by mouth daily.   Yes Historical Provider, MD  Menthol-Methyl Salicylate (MUSCLE RUB) 10-15 % CREA Apply  1 application topically as needed (finger pain).    Yes Historical Provider, MD  Multiple Vitamins-Minerals (HAIR/SKIN/NAILS PO) Take 1 tablet by mouth 3 (three) times daily.   Yes Historical Provider, MD  traMADol (ULTRAM) 50 MG tablet Take 1 tablet (50 mg total) by mouth every 6 (six) hours as needed for severe pain. 05/12/14   Rucha Wissinger L Osman Calzadilla, PA-C   BP 147/100  Pulse 93  Temp(Src) 98.2 F (36.8 C) (Oral)  Resp 18  SpO2 99%  Physical Exam  Nursing note and vitals reviewed. Constitutional: She is oriented to person, place, and time. She appears well-developed and well-nourished. No distress.  HENT:  Head: Normocephalic and atraumatic.  Right Ear: External ear normal.  Left Ear: External ear normal.  Nose: Nose normal.  Mouth/Throat: Oropharynx is clear and moist.  Eyes: Conjunctivae are normal.  Neck: Normal range of motion. Neck supple.  Cardiovascular: Normal rate, regular rhythm, normal heart sounds and intact distal pulses.   Pulmonary/Chest: Effort normal and breath sounds normal. No respiratory distress. She has no wheezes. She has no rales.  Abdominal: Soft.  Musculoskeletal: Normal range of motion.       Right wrist: Normal.       Left wrist: Normal.       Left hand: Normal.       Hands: Neurological: She is alert and oriented to  person, place, and time.  Skin: Skin is warm and dry. She is not diaphoretic.  Psychiatric: She has a normal mood and affect.    ED Course  Procedures (including critical care time) Medications  traMADol (ULTRAM) tablet 50 mg (50 mg Oral Given 05/12/14 1443)    DIAGNOSTIC STUDIES: Oxygen Saturation is 100% on RA, normal by my interpretation.    COORDINATION OF CARE: 2:15 PM-Discussed treatment plan which includes xray with pt at bedside and pt agreed to plan.   Labs Review Labs Reviewed - No data to display  Imaging Review Dg Finger Index Right  05/12/2014   CLINICAL DATA:  Right index finger pain.  EXAM: RIGHT INDEX FINGER  2+V  COMPARISON:  No priors.  FINDINGS: Three views of the right second digit demonstrate no acute displaced fracture, subluxation or dislocation.  IMPRESSION: 1. No acute radiographic abnormality of the right index finger.   Electronically Signed   By: Trudie Reed M.D.   On: 05/12/2014 14:25     EKG Interpretation None      MDM   Final diagnoses:  Contusion of right index finger without damage to nail, initial encounter    Filed Vitals:   05/12/14 1444  BP:   Pulse: 93  Temp:   Resp: 18   Afebrile, NAD, non-toxic appearing, AAOx4. Neurovascularly intact. Normal sensation. Imaging shows no fracture. Directed pt to ice injury, take acetaminophen or ibuprofen for pain, and to elevate and rest the injury when possible. Patient is stable at time of discharge    I personally performed the services described in this documentation, which was scribed in my presence. The recorded information has been reviewed and is accurate.  Jeannetta Ellis, PA-C 05/12/14 1743

## 2014-05-12 NOTE — ED Notes (Signed)
Pt reports having having right index finger pain and swelling since last week. 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 Pt reports that the pain has gotten worse over the past week. Pt is A/O x4 and in NAD.

## 2014-05-12 NOTE — Discharge Instructions (Signed)
Your x-ray was negative for any broken bones. Please follow up with your primary care physician in 1-2 days. If you do not have one please call the [facility] number listed above. Please take pain medication and/or muscle relaxants as prescribed and as needed for pain. Please do not drive on narcotic pain medication or on muscle relaxants. Please follow RICE method below.    Contusion A contusion is a deep bruise. Contusions are the result of an injury that caused bleeding under the skin. The contusion may turn blue, purple, or yellow. Minor injuries will give you a painless contusion, but more severe contusions may stay painful and swollen for a few weeks.  CAUSES  A contusion is usually caused by a blow, trauma, or direct force to an area of the body. SYMPTOMS   Swelling and redness of the injured area.  Bruising of the injured area.  Tenderness and soreness of the injured area.  Pain. DIAGNOSIS  The diagnosis can be made by taking a history and physical exam. An X-ray, CT scan, or MRI may be needed to determine if there were any associated injuries, such as fractures. TREATMENT  Specific treatment will depend on what area of the body was injured. In general, the best treatment for a contusion is resting, icing, elevating, and applying cold compresses to the injured area. Over-the-counter medicines may also be recommended for pain control. Ask your caregiver what the best treatment is for your contusion. HOME CARE INSTRUCTIONS   Put ice on the injured area.  Put ice in a plastic bag.  Place a towel between your skin and the bag.  Leave the ice on for 15-20 minutes, 3-4 times a day, or as directed by your health care provider.  Only take over-the-counter or prescription medicines for pain, discomfort, or fever as directed by your caregiver. Your caregiver may recommend avoiding anti-inflammatory medicines (aspirin, ibuprofen, and naproxen) for 48 hours because  these medicines may increase bruising.  Rest the injured area.  If possible, elevate the injured area to reduce swelling. SEEK IMMEDIATE MEDICAL CARE IF:   You have increased bruising or swelling.  You have pain that is getting worse.  Your swelling or pain is not relieved with medicines. MAKE SURE YOU:   Understand these instructions.  Will watch your condition.  Will get help right away if you are not doing well or get worse. Document Released: 05/27/2005 Document Revised: 08/22/2013 Document Reviewed: 06/22/2011 [facility] Patient Information 2015 [place], [place]. This information is not intended to replace advice given to you by your health care provider. Make sure you discuss any questions you have with your health care provider.

## 2014-05-13 NOTE — ED Provider Notes (Signed)
Medical screening examination/treatment/procedure(s) were performed by non-physician practitioner and as supervising physician I was immediately available for consultation/collaboration.  Toy Cookey, MD 05/13/14 1911

## 2014-06-18 ENCOUNTER — Encounter (HOSPITAL_COMMUNITY): Payer: Self-pay | Admitting: Emergency Medicine

## 2014-06-18 ENCOUNTER — Emergency Department (HOSPITAL_COMMUNITY)
Admission: EM | Admit: 2014-06-18 | Discharge: 2014-06-18 | Disposition: A | Discharge status: Home / Self Care | Payer: No Typology Code available for payment source | Attending: Emergency Medicine | Admitting: Emergency Medicine

## 2014-06-18 ENCOUNTER — Emergency Department (HOSPITAL_COMMUNITY): Payer: No Typology Code available for payment source

## 2014-06-18 DIAGNOSIS — Z87891 Personal history of nicotine dependence: Secondary | ICD-10-CM | POA: Insufficient documentation

## 2014-06-18 DIAGNOSIS — Z8659 Personal history of other mental and behavioral disorders: Secondary | ICD-10-CM | POA: Insufficient documentation

## 2014-06-18 DIAGNOSIS — J069 Acute upper respiratory infection, unspecified: Secondary | ICD-10-CM | POA: Insufficient documentation

## 2014-06-18 DIAGNOSIS — E669 Obesity, unspecified: Secondary | ICD-10-CM | POA: Insufficient documentation

## 2014-06-18 DIAGNOSIS — R079 Chest pain, unspecified: Secondary | ICD-10-CM | POA: Insufficient documentation

## 2014-06-18 DIAGNOSIS — Z7982 Long term (current) use of aspirin: Secondary | ICD-10-CM | POA: Insufficient documentation

## 2014-06-18 LAB — CBC
HEMATOCRIT: 38.5 % (ref 36.0–46.0)
Hemoglobin: 12.5 g/dL (ref 12.0–15.0)
MCH: 28.2 pg (ref 26.0–34.0)
MCHC: 32.5 g/dL (ref 30.0–36.0)
MCV: 86.9 fL (ref 78.0–100.0)
Platelets: 263 10*3/uL (ref 150–400)
RBC: 4.43 MIL/uL (ref 3.87–5.11)
RDW: 13.6 % (ref 11.5–15.5)
WBC: 8 10*3/uL (ref 4.0–10.5)

## 2014-06-18 LAB — I-STAT TROPONIN, ED: Troponin i, poc: 0 ng/mL (ref 0.00–0.08)

## 2014-06-18 LAB — BASIC METABOLIC PANEL
Anion gap: 11 (ref 5–15)
BUN: 11 mg/dL (ref 6–23)
CALCIUM: 9.4 mg/dL (ref 8.4–10.5)
CHLORIDE: 98 meq/L (ref 96–112)
CO2: 25 meq/L (ref 19–32)
Creatinine, Ser: 0.71 mg/dL (ref 0.50–1.10)
GFR calc Af Amer: >90 mL/min (ref >90)
GFR calc non Af Amer: >90 mL/min (ref >90)
Glucose, Bld: 117 mg/dL — ABNORMAL HIGH (ref 70–99)
Potassium: 4 mEq/L (ref 3.7–5.3)
Sodium: 134 mEq/L — ABNORMAL LOW (ref 137–147)

## 2014-06-18 LAB — RAPID STREP SCREEN (MED CTR MEBANE ONLY): Streptococcus, Group A Screen (Direct): NEGATIVE

## 2014-06-18 MED ORDER — GUAIFENESIN 100 MG/5ML PO LIQD: 100.0000 mg | ORAL | Status: DC | PRN

## 2014-06-18 MED ORDER — ACETAMINOPHEN 500 MG PO TABS: 500.0000 mg | ORAL_TABLET | Freq: Four times a day (QID) | ORAL | Status: DC | PRN

## 2014-06-18 MED ORDER — BENZONATATE 100 MG PO CAPS: 100.0000 mg | ORAL_CAPSULE | Freq: Three times a day (TID) | ORAL | Status: DC

## 2014-06-18 NOTE — ED Notes (Signed)
Pt reports chest pain which started x 1 week ago and sore throat which started x 2 days ago.  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  Pt denies any SOB, dizziness or radiation of cp at this time.

## 2014-06-18 NOTE — ED Provider Notes (Signed)
CSN: [id number]     Arrival date & time 06/18/14  2032 History   First MD Initiated Contact with Patient 06/18/14 2148     Chief Complaint  Patient presents with  . Chest Pain  . Sore Throat     (Consider location/radiation/quality/duration/timing/severity/associated sxs/prior Treatment) HPI Pt is a 48yo female with hx of panic attacks presenting to ED with c/o intermittent chest pain that started 1 week ago, associated with cough, congestion, and a sore throat that started 2 days ago. Pt states she believes she was coming down with "the flu or something" reports subjective fever and decreased appetite. States she has been taking Bayer aspirin and alk-seltzer w/o relief of symptoms. Denies n/v/d.  Denies SOB. 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 Denies hx of asthma or COPD.    Past Medical History  Diagnosis Date  . Obesity   . Panic attacks    Past Surgical History  Procedure Laterality Date  . Partial hysterectomy     Family History  Problem Relation Age of Onset  . Osteoarthritis Mother   . Hypertension Father   . Hypertension Sister    History  Substance Use Topics  . Smoking status: Former Smoker    Types: Cigarettes    Quit date: 05/01/2013  . Smokeless tobacco: Never Used  . Alcohol Use: No   OB History   Grav Para Term Preterm Abortions TAB SAB Ect Mult Living                 Review of Systems  Constitutional: Positive for fever ( subjective), chills and appetite change ( decreased). Negative for diaphoresis and fatigue.  HENT: Positive for congestion, rhinorrhea and sore throat. Negative for trouble swallowing and voice change.   Respiratory: Positive for cough and shortness of breath.   Cardiovascular: Positive for chest pain. Negative for palpitations and leg swelling.  Gastrointestinal: Negative for nausea, vomiting, abdominal pain and diarrhea.  All other systems reviewed and are negative.     Allergies   Ibuprofen  Home Medications   Prior to Admission medications   Medication Sig Start Date End Date Taking? Authorizing Provider  aspirin 81 MG chewable tablet Chew 162 mg by mouth once.   Yes Historical Provider, MD  acetaminophen (TYLENOL) 500 MG tablet Take 1 tablet (500 mg total) by mouth every 6 (six) hours as needed. 06/18/14   Noland Fordyce, PA-C  benzonatate (TESSALON) 100 MG capsule Take 1 capsule (100 mg total) by mouth every 8 (eight) hours. 06/18/14   Noland Fordyce, PA-C  guaiFENesin (ROBITUSSIN) 100 MG/5ML liquid Take 5-10 mLs (100-200 mg total) by mouth every 4 (four) hours as needed for cough. 06/18/14   Noland Fordyce, PA-C   BP 126/84  Pulse 84  Temp(Src) 98.1 F (36.7 C) (Oral)  Resp 18  SpO2 100% Physical Exam  Nursing note and vitals reviewed. Constitutional: She appears well-developed and well-nourished. No distress.  HENT:  Head: Normocephalic and atraumatic.  Right Ear: Hearing, tympanic membrane, external ear and ear canal normal.  Left Ear: Hearing, tympanic membrane, external ear and ear canal normal.  Nose: Mucosal edema present.  Mouth/Throat: Uvula is midline and mucous membranes are normal. Posterior oropharyngeal edema and posterior oropharyngeal erythema present.  Eyes: Conjunctivae are normal. No scleral icterus.  Neck: Normal range of motion.  Cardiovascular: Normal rate, regular rhythm and normal heart sounds.   Regular rate and rhythm  Pulmonary/Chest: Effort normal and breath sounds normal. No respiratory  distress. She has no wheezes. She has no rales. She exhibits tenderness (bilateral, anterior. ).  No respiratory distress, able to speak in full sentences w/o difficulty. Lungs: CTAB. Anterior chest wall tenderness w/o crepitus or flail chest.   Abdominal: Soft. Bowel sounds are normal. She exhibits no distension and no mass. There is no tenderness. There is no rebound and no guarding.  Musculoskeletal: Normal range of motion.  Neurological: She  is alert.  Skin: Skin is warm and dry. She is not diaphoretic.    ED Course  Procedures (including critical care time) Labs Review Labs Reviewed  BASIC METABOLIC PANEL - Abnormal; Notable for the following:    Sodium 134 (*)    Glucose, Bld 117 (*)    All other components within normal limits  RAPID STREP SCREEN  CULTURE, GROUP A STREP  CBC  I-STAT TROPOININ, ED    Imaging Review Dg Chest 2 View  06/18/2014   CLINICAL DATA:  48 year old female with chest pain and sore throat for several days. Subjective fever. Initial encounter.  EXAM: CHEST  2 VIEW  COMPARISON:  05/12/2013 and earlier.  FINDINGS: Mildly improved lung volumes. Normal cardiac size and mediastinal contours. Visualized tracheal air column is within normal limits. Stable mild increased interstitial markings. No pneumothorax, pulmonary edema, pleural effusion or confluent pulmonary opacity. No acute osseous abnormality identified.  IMPRESSION: No acute cardiopulmonary abnormality.   Electronically Signed   By: Lars Pinks M.D.   On: 06/18/2014 22:27     EKG Interpretation None      MDM   Final diagnoses:  URI, acute    Pt presenting to ED with signs and symptoms of URI. Not concerned for ACS or PE. PERC negative. No evidence of pneumonia on CXR.  Rapid strep: negative. Will tx symptomatically for URI.  Rx: guaifenesin, tessalon, and acetaminophen.  Las: unremarkable.  Will discharge home to f/u with PCP. Home care instructions provided. Return precautions provided. Pt verbalized understanding and agreement with tx plan.     Noland Fordyce, PA-C 06/19/14 [phone]

## 2014-06-18 NOTE — Discharge Instructions (Signed)
Cool Mist Vaporizers °Vaporizers may help relieve the symptoms of a cough and cold. They add moisture to the air, which helps mucus to become thinner and less sticky. This makes it easier to breathe and cough up secretions. Cool mist vaporizers do not cause serious burns like hot mist vaporizers, which may also be called steamers or humidifiers. Vaporizers have not been proven to help with colds. You should not use a vaporizer if you are allergic to mold. °HOME CARE INSTRUCTIONS °· Follow the package instructions for the vaporizer. °· Do not use anything other than distilled water in the vaporizer. °· Do not run the vaporizer all of the time. This can cause mold or bacteria to grow in the vaporizer. °· Clean the vaporizer after each time it is used. °· Clean and dry the vaporizer well before storing it. °· Stop using the vaporizer if worsening respiratory symptoms develop. °Document Released: 05/14/2004 Document Revised: 08/22/2013 Document Reviewed: 01/04/2013 °[employer]® Patient Information ©2015 [employer]. This information is not intended to replace advice given to you by your health care provider. Make sure you discuss any questions you have with your health care provider. ° °

## 2014-06-19 NOTE — ED Provider Notes (Signed)
Medical screening examination/treatment/procedure(s) were performed by non-physician practitioner and as supervising physician I was immediately available for consultation/collaboration.   EKG Interpretation None        Kairee Kozma, MD 06/19/14 0047 

## 2014-06-20 LAB — CULTURE, GROUP A STREP

## 2014-08-02 ENCOUNTER — Encounter (HOSPITAL_COMMUNITY): Payer: Self-pay | Admitting: Emergency Medicine

## 2014-08-02 ENCOUNTER — Emergency Department (HOSPITAL_COMMUNITY): Payer: No Typology Code available for payment source

## 2014-08-02 ENCOUNTER — Emergency Department (HOSPITAL_COMMUNITY)
Admission: EM | Admit: 2014-08-02 | Discharge: 2014-08-02 | Disposition: A | Discharge status: Home / Self Care | Payer: No Typology Code available for payment source | Attending: Emergency Medicine | Admitting: Emergency Medicine

## 2014-08-02 DIAGNOSIS — F419 Anxiety disorder, unspecified: Secondary | ICD-10-CM

## 2014-08-02 DIAGNOSIS — Z7982 Long term (current) use of aspirin: Secondary | ICD-10-CM | POA: Insufficient documentation

## 2014-08-02 DIAGNOSIS — R200 Anesthesia of skin: Secondary | ICD-10-CM | POA: Insufficient documentation

## 2014-08-02 DIAGNOSIS — R0789 Other chest pain: Secondary | ICD-10-CM

## 2014-08-02 DIAGNOSIS — R0602 Shortness of breath: Secondary | ICD-10-CM | POA: Insufficient documentation

## 2014-08-02 DIAGNOSIS — Z87891 Personal history of nicotine dependence: Secondary | ICD-10-CM | POA: Insufficient documentation

## 2014-08-02 DIAGNOSIS — R079 Chest pain, unspecified: Secondary | ICD-10-CM

## 2014-08-02 DIAGNOSIS — E669 Obesity, unspecified: Secondary | ICD-10-CM | POA: Insufficient documentation

## 2014-08-02 LAB — CBC
HCT: 38.4 % (ref 36.0–46.0)
Hemoglobin: 12.2 g/dL (ref 12.0–15.0)
MCH: 27.6 pg (ref 26.0–34.0)
MCHC: 31.8 g/dL (ref 30.0–36.0)
MCV: 86.9 fL (ref 78.0–100.0)
PLATELETS: 277 10*3/uL (ref 150–400)
RBC: 4.42 MIL/uL (ref 3.87–5.11)
RDW: 13.9 % (ref 11.5–15.5)
WBC: 6.2 10*3/uL (ref 4.0–10.5)

## 2014-08-02 LAB — BASIC METABOLIC PANEL
ANION GAP: 16 — AB (ref 5–15)
BUN: 10 mg/dL (ref 6–23)
CO2: 23 mEq/L (ref 19–32)
Calcium: 9.4 mg/dL (ref 8.4–10.5)
Chloride: 99 mEq/L (ref 96–112)
Creatinine, Ser: 0.7 mg/dL (ref 0.50–1.10)
GFR calc non Af Amer: >90 mL/min (ref >90)
Glucose, Bld: 111 mg/dL — ABNORMAL HIGH (ref 70–99)
Potassium: 4.2 mEq/L (ref 3.7–5.3)
Sodium: 138 mEq/L (ref 137–147)

## 2014-08-02 LAB — I-STAT TROPONIN, ED: Troponin i, poc: 0 ng/mL (ref 0.00–0.08)

## 2014-08-02 MED ORDER — TRAMADOL HCL 50 MG PO TABS: 50.0000 mg | ORAL_TABLET | Freq: Four times a day (QID) | ORAL | Status: DC | PRN

## 2014-08-02 MED ORDER — MORPHINE SULFATE 2 MG/ML IJ SOLN
2.0000 mg | Freq: Once | INTRAMUSCULAR | Status: AC
Start: 2014-08-02 — End: 2014-08-02
  Administered 2014-08-02: 2 mg via INTRAVENOUS
  Filled 2014-08-02: qty 1

## 2014-08-02 NOTE — Discharge Instructions (Signed)
Chest Wall Pain °Chest wall pain is pain in or around the bones and muscles of your chest. It may take up to 6 weeks to get better. It may take longer if you must stay physically active in your work and activities.  °CAUSES  °Chest wall pain may happen on its own. However, it may be caused by: °· A viral illness like the flu. °· Injury. °· Coughing. °· Exercise. °· Arthritis. °· Fibromyalgia. °· Shingles. °HOME CARE INSTRUCTIONS  °· Avoid overtiring physical activity. Try not to strain or perform activities that cause pain. This includes any activities using your chest or your abdominal and side muscles, especially if heavy weights are used. °· Put ice on the sore area. °¨ Put ice in a plastic bag. °¨ Place a towel between your skin and the bag. °¨ Leave the ice on for 15-20 minutes per hour while awake for the first 2 days. °· Only take over-the-counter or prescription medicines for pain, discomfort, or fever as directed by your caregiver. °SEEK IMMEDIATE MEDICAL CARE IF:  °· Your pain increases, or you are very uncomfortable. °· You have a fever. °· Your chest pain becomes worse. °· You have new, unexplained symptoms. °· You have nausea or vomiting. °· You feel sweaty or lightheaded. °· You have a cough with phlegm (sputum), or you cough up blood. °MAKE SURE YOU:  °· Understand these instructions. °· Will watch your condition. °· Will get help right away if you are not doing well or get worse. °Document Released: 08/17/2005 Document Revised: 11/09/2011 Document Reviewed: 04/13/2011 °ExitCare® Patient Information ©2015 [employer]. This information is not intended to replace advice given to you by your health care provider. Make sure you discuss any questions you have with your health care provider. ° ° °Emergency Department Resource Guide °1) Find a Doctor and Pay Out of Pocket °Although you won't have to find out who is covered by your insurance plan, it is a good idea to ask around and get recommendations. You  will then need to call the office and see if the doctor you have chosen will accept you as a new patient and what types of options they offer for patients who are self-pay. Some doctors offer discounts or will set up payment plans for their patients who do not have insurance, but you will need to ask so you aren't surprised when you get to your appointment. ° °2) Contact Your Local Health Department °Not all health departments have doctors that can see patients for sick visits, but many do, so it is worth a call to see if yours does. If you don't know where your local health department is, you can check in your phone book. The CDC also has a tool to help you locate your state's health department, and many state websites also have listings of all of their local health departments. ° °3) Find a Walk-in Clinic °If your illness is not likely to be very severe or complicated, you may want to try a walk in clinic. These are popping up all over the country in pharmacies, drugstores, and shopping centers. They're usually staffed by nurse practitioners or physician assistants that have been trained to treat common illnesses and complaints. They're usually fairly quick and inexpensive. However, if you have serious medical issues or chronic medical problems, these are probably not your best option. ° °No Primary Care Doctor: °- Call [facility] at  832-8000 - they can help you locate a primary care doctor that    accepts your insurance, provides certain services, etc. °- Physician Referral Service- 1-800-533-3463 ° °Chronic Pain Problems: °Organization         Address  Phone   Notes  °[facility]  (336) 297-2271 Patients need to be referred by their primary care doctor.  ° °Medication Assistance: °Organization         Address  Phone   Notes  °[place] [facility] [address] °[place], [place] [address] (336) 641-8030 --Must be a resident of [place] °-- Must  have NO insurance coverage whatsoever (no Medicaid/ Medicare, etc.) °-- The pt. MUST have a primary care doctor that directs their care regularly and follows them in the community °  °[facility]  (866) 331-1348   °[employer]  (888) 892-1162   ° °Agencies that provide inexpensive medical care: °Organization         Address  Phone   Notes  °[facility]  (336) 832-8035   °[facility]    (336) 832-7272   °[facility] [address] °[place], [place] [address] (336) 832-4777   °[facility] [address], °[place] (336) 271-4999   °[facility]    (336) 373-0678   °[facility]    (336) 272-1050   °[facility] ° [address], [place] Phone:  (336) 832-4444, Fax:  (336) 832-4440 Hours of Operation:  9 am - 6 pm, M-F.  Also accepts Medicaid/Medicare and self-pay.  °[facility] ° [address], [place] Phone: (336) 832-3150, Fax: (336) 832-3151. Hours of Operation:  8:30 am - 5:30 pm, M-F.  Also accepts Medicaid and self-pay.  °[facility] [place] [address], [place] Phone: (336) 878-6027   °[facility] [address], [place], [place] (336)723-1848, Ext. 123 Mondays & Thursdays: 7-9 AM.  First 15 patients are seen on a first come, first serve basis. °  ° °Medicaid-accepting [place] Providers: ° °Organization         Address  Phone   Notes  °[facility] [address], [place] (336) 641-2100 Also accepts self-pay patients.  °[facility] [address], [place] ° (336) 856-9996   °[facility] [address], [place] (336) 288-8857   °[facility] [address] [place] [address], [place] (336) 299-7000   °Veita Bland [address], [place]  ° (336) 373-1557 Only accepts [place] Access Medicaid patients after  they have their name applied to their card.  ° °Self-Pay (no insurance) in [place]: ° °Organization         Address  Phone   Notes  °Sickle Cell Patients, [facility] [address], [place] (336) 832-1970   °[facility] [address], [place] (336) 832-4400   °[facility] ° [address], [place] (336) 992-4800   °[facility]/Dr. Osei-Bonsu ° [address] [place] [address], [place] or [address], [place] (336) 841-8500 Phone number for both [place] and [place] locations is the same.  °[facility] [address], [place] (336) 299-0000   °[facility] [address] [place] [address], [place] or [address] (336) 852-7530 °(336) 878-2260   °[facility] [address], [place] (336) 350-1642, phone; (336) 294-5005, fax Sees patients 1st and 3rd Saturday of every month.  Must not qualify for public or private insurance (i.e. Medicaid, Medicare, [place] Health Choice, Veterans' Benefits) • Household income should be no more than 200% of the poverty level •The clinic cannot treat you if you are pregnant or think you are pregnant • Sexually transmitted diseases are not treated at the clinic.  ° ° °Dental Care: °Organization         Address  Phone  Notes  °[facility] [address], [place] (336) 641-6152 Accepts children up to age 21 who are enrolled in Medicaid or [place] Health Choice; pregnant women with a Medicaid card; and children who have applied for Medicaid or [place] Health Choice, but were declined, whose parents can pay a reduced fee at time of service.  °[facility] [place]  [address], [place] (336) 641-7733 Accepts children up to age 21 who are enrolled in Medicaid or [place] Health Choice; pregnant women with a Medicaid card; and children who  have applied for Medicaid or [place] Health Choice, but were declined, whose parents can pay a reduced fee at time of service.  °[facility] ° [address], [place] (336) 641-4533 Patients are seen by appointment only. Walk-ins are not accepted. [facility] will see patients 18 years of age and older. °Monday - Tuesday (8am-5pm) °Most Wednesdays (8:30-5pm) °$30 per visit, cash only  °[facility] ° [address], [place] (336) 641-4533 Patients are seen by appointment only. Walk-ins are not accepted. [facility] will see patients 18 years of age and older. °One Wednesday Evening (Monthly: Volunteer Based).  $30 per visit, cash only  °[facility]  (919) 537-3737 for adults; Children under age 4, call Graduate Pediatric Dentistry at (919) [phone]. Children aged 4-14, please call (919) [phone] to request a pediatric application. ° Dental services are provided in all areas of dental care including fillings, crowns and bridges, complete and partial dentures, implants, gum treatment, root canals, and extractions. Preventive care is also provided. Treatment is provided to both adults and children. °Patients are selected via a lottery and there is often a waiting list. °  °[facility] [address], °[place] ° (336) 763-8833 [email or web address] °  °[facility] [address], [place], [place] (336)723-1848, Ext. 123 Second and Fourth Thursday of each month, opens at 6:30 AM; Clinic ends at 9 AM.  Patients are seen on a first-come first-served basis, and a limited number are seen during each clinic.  ° °[facility] ° [address], [place], [place] (336) 723-7904   Eligibility Requirements °You must have lived in [place], [place], or [place] counties for at least the last three months. °  You cannot be eligible for state or federal sponsored healthcare insurance, including Veterans  Administration, Medicaid, or Medicare. °  You generally cannot be eligible for healthcare insurance through your employer.  °  How to apply: °Eligibility screenings are held every Tuesday and Wednesday afternoon from 1:00 pm until 4:00 pm. You do not need an appointment for the interview!  °[facility] [address], [place], [place] 336-631-2330   °[facility]  336-342-8273   °[facility]  336-703-3100   °[facility]    336-570-6415   ° °Behavioral Health Resources in the Community: °Intensive Outpatient Programs °Organization         Address  Phone  Notes  °[facility] [address], [place], [place] 336-878-6098   °[facility] [address], [place], [place] 336-832-9800   °[facility] [address], [place], [place] ° 336-882-2125   °[facility] [address],  °[place], [place] 1-800-853-5163 or 336-641-4981   °Substance Abuse Resources °Organization         Address  Phone  Notes  °[facility]  336-882-2125   °[facility]  336-784-9470   °[facility]  336-285-9073   °[facility]  336-845-3988   °Residential & Outpatient Substance Abuse Program  1-800-659-3381   °Psychological Services °Organization         Address  Phone  Notes  °[facility]  336- 832-9600   °[facility]  336- 378-7881   °[facility] [address], [place] 1-800-853-5163 or 336-641-4981   ° °Mobile Crisis Teams °Organization         Address  Phone  Notes  °[facility], Mobile Crisis Care Unit  1-877-626-1772   °Assertive °[facility] ° [address] [place], [place] 336-834-9664   °Sharon DeEsch [address] °[place] 336-554-5454   ° °Self-Help/Support Groups °Organization         Address  Phone             Notes  °[facility] -  variety of support groups  336- 373-1402 Call for more information  °Narcotics Anonymous (NA), [facility] [address], °[place]  2 meetings at this location  ° °Residential Treatment Programs °Organization         Address  Phone  Notes  °[facility] [address],    °[place]  1-866-801-8205   °[facility] ° [address], [place], [place] 704-293-8524   °[facility] [address], [place] 336-845-3988 Admissions: 8am-3pm M-F  °[facility] [address]    °[place], [place] 336-841-1104   °[facility] [address], [place], [place] 336-379-7146   °[facility] [address]  °[place], [place] 336-285-9073   °[facility] - Intensive Outpatient [address], [place], [place] 336-852-3033   °[facility] [address]  °[place], [place] 1-877-615-2722 or 336-784-9470   °[facility] [address] [place], [place] 336-227-7417 Accepts Medicaid  °[facility] [address]  °[place] 1-800-659-3381 Substance Abuse/Addiction Treatment  ° °[place] Behavioral Health Resources °Organization         Address  Phone  Notes  °[facility]  (888) 581-9988   °Julie Brannon, PhD [address] [place], [place]   (336) 349-5553 or (336) 951-0000   °[facility]   [address] °[place],  (336) 349-4454   °[facility] [address], [place],  (336) 342-8316 Insurance/Medicaid/sponsorship through [facility]  °[facility] [address]                                    [place],  (336) 342-8316 Therapy/tele-psych/case  °[facility] [address]  ° [place], [place] (336) 349-2233    °Dr. Arfeen  (336) 349-4544   °[facility] [place]  [employer] [place] [facility] 1) [address], [place] °2) [address], [place] °3)  [address], [place] (336) 349-3220 °(336) 342-7768 ° °(336) 342-8140   °[place] [facility] (336) 342-1394 or (336) 342-3537 (After Hours)    ° ° ° °

## 2014-08-02 NOTE — ED Notes (Addendum)
Pt alert and oriented x4. Respirations even and unlabored, bilateral symmetrical rise and fall of chest. Skin warm and dry. In no acute distress. Denies needs.   

## 2014-08-02 NOTE — ED Provider Notes (Signed)
CSN: [id number]     Arrival date & time 08/02/14  [id number] History   First MD Initiated Contact with Patient 08/02/14 1031     Chief Complaint  Patient presents with  . Shortness of Breath  . Chest Pain   HPI  Patient is a 48 year old female who presents emergency room for evaluation of chest pain 1 week and shortness of breath. Patient states that her chest pain as right-sided, constant, sharp aching which does not radiate to her back. 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 She has found no other aggravating factors at this time. She has not found any relieving factors at this time although she has tried aspirin. Patient also notes that she is having intermittent episodes of shortness of breath. She says that during these episodes which do not seem to have any trigger she feels like she is choking and is short of breath and sometimes gets tingling in her fingers on both hands. Patient has no cardiac history that she is aware of. She has no family history of heart problems. She was a former smoker but smoked for less than 5 years. She quit a long time ago. Patient has not traveled recently. She is not taking any medications on a regular basis. She has had no recent surgeries or long travels. She has not been immobilized. She is not taking estrogen. She has no history of blood clots.  Past Medical History  Diagnosis Date  . Obesity   . Panic attacks    Past Surgical History  Procedure Laterality Date  . Partial hysterectomy     Family History  Problem Relation Age of Onset  . Osteoarthritis Mother   . Hypertension Father   . Hypertension Sister    History  Substance Use Topics  . Smoking status: Former Smoker    Types: Cigarettes    Quit date: 05/01/2013  . Smokeless tobacco: Never Used  . Alcohol Use: No   OB History    No data available     Review of Systems  Respiratory: Positive for chest tightness and shortness of breath. Negative  for cough and wheezing.   Cardiovascular: Positive for chest pain. Negative for palpitations and leg swelling.  Gastrointestinal: Negative for nausea, vomiting, abdominal pain, diarrhea and constipation.  Neurological: Positive for numbness.  All other systems reviewed and are negative.     Allergies  Ibuprofen  Home Medications   Prior to Admission medications   Medication Sig Start Date End Date Taking? Authorizing Provider  aspirin 81 MG chewable tablet Chew 162 mg by mouth 2 (two) times daily.    Yes Historical Provider, MD  acetaminophen (TYLENOL) 500 MG tablet Take 1 tablet (500 mg total) by mouth every 6 (six) hours as needed. Patient not taking: Reported on 08/02/2014 06/18/14   Junius FinnerErin O'Malley, PA-C  benzonatate (TESSALON) 100 MG capsule Take 1 capsule (100 mg total) by mouth every 8 (eight) hours. Patient not taking: Reported on 08/02/2014 06/18/14   Junius FinnerErin O'Malley, PA-C  guaiFENesin (ROBITUSSIN) 100 MG/5ML liquid Take 5-10 mLs (100-200 mg total) by mouth every 4 (four) hours as needed for cough. Patient not taking: Reported on 08/02/2014 06/18/14   Junius FinnerErin O'Malley, PA-C  traMADol (ULTRAM) 50 MG tablet Take 1 tablet (50 mg total) by mouth every 6 (six) hours as needed. 08/02/14   Yassine Brunsman A Forcucci, PA-C   BP 137/87 mmHg  Pulse 66  Temp(Src) 98.5 F (36.9 C) (Oral)  Resp 16  SpO2 100% Physical Exam  Constitutional: She is oriented to person, place, and time. She appears well-developed and well-nourished. No distress.  HENT:  Head: Normocephalic and atraumatic.  Mouth/Throat: Oropharynx is clear and moist. No oropharyngeal exudate.  Eyes: Conjunctivae and EOM are normal. Pupils are equal, round, and reactive to light. No scleral icterus.  Neck: Normal range of motion. Neck supple. No JVD present. No thyromegaly present.  Cardiovascular: Normal rate, regular rhythm, normal heart sounds and intact distal pulses.  Exam reveals no gallop and no friction rub.   No murmur  heard. Pulmonary/Chest: Effort normal and breath sounds normal. No respiratory distress. She has no wheezes. She has no rales. She exhibits tenderness.  Abdominal: Soft. Bowel sounds are normal. She exhibits no distension and no mass. There is no tenderness. There is no rebound and no guarding.  Musculoskeletal: Normal range of motion.  Lymphadenopathy:    She has no cervical adenopathy.  Neurological: She is alert and oriented to person, place, and time. She has normal strength. No cranial nerve deficit or sensory deficit. Coordination normal.  Skin: Skin is warm and dry. She is not diaphoretic.  Psychiatric: She has a normal mood and affect. Her behavior is normal. Judgment and thought content normal.  Nursing note and vitals reviewed.   ED Course  Procedures (including critical care time) Labs Review Labs Reviewed  BASIC METABOLIC PANEL - Abnormal; Notable for the following:    Glucose, Bld 111 (*)    Anion gap 16 (*)    All other components within normal limits  CBC  PRO B NATRIURETIC PEPTIDE  I-STAT TROPOININ, ED    Imaging Review Dg Chest 2 View  08/02/2014   CLINICAL DATA:  Shortness of breath and chest pain for 1 week, cough  EXAM: CHEST  2 VIEW  COMPARISON:  06/18/2014  FINDINGS: Cardiomediastinal silhouette is stable. No acute infiltrate or pulmonary edema. Mild infrahilar increased bronchial markings suspicious for bronchitic changes. Mild degenerative changes mid and lower thoracic spine.  IMPRESSION: No acute infiltrate or pulmonary edema. Mild infrahilar increased bronchial markings suspicious for bronchitic changes. Mild degenerative changes thoracic spine.   Electronically Signed   By: Natasha MeadLiviu  Pop M.D.   On: 08/02/2014 11:41     EKG Interpretation None      MDM   Final diagnoses:  Chest pain  Anxiety  Chest wall pain   Patient is a 48 year old female who presents emergency room for evaluation of chest pain. Physical exam reveals alert nontoxic-appearing female  with chest tenderness to palpation. There are no focal neurological deficits. Troponin is negative. BNP is negative. CBC is unremarkable. BMP is unremarkable. Chest x-ray is negative for acute infiltrates. Suspect that this is likely musculoskeletal chest pain given the reproducible nature on palpation. I will discharge the patient home with tramadol as needed for pain. I suspect that shortness of breath is likely related to anxiety. I will refer the patient to monitor For further And evaluation. Patient is to return for ACS symptoms which we reviewed. Patient states understanding and agreement at this time. Patient is stable for discharge. She was seen by Dr. Madilyn Hookees who agrees with the above workup and plan.    Eben Burowourtney A Forcucci, PA-C 08/02/14 1252  Tilden FossaElizabeth Rees, MD 08/02/14 [phone]

## 2014-08-02 NOTE — ED Notes (Signed)
Pt to xray

## 2014-08-02 NOTE — ED Notes (Addendum)
Pt c/o SOB and chest pain x 1 week. Pt also has a dry cough. Pt denies n/v dizziness. Pt has hx of panic attacks

## 2014-12-17 ENCOUNTER — Emergency Department (HOSPITAL_COMMUNITY): Payer: 59

## 2014-12-17 ENCOUNTER — Encounter (HOSPITAL_COMMUNITY): Payer: Self-pay | Admitting: Emergency Medicine

## 2014-12-17 ENCOUNTER — Emergency Department (HOSPITAL_COMMUNITY)
Admission: EM | Admit: 2014-12-17 | Discharge: 2014-12-17 | Disposition: A | Discharge status: Home / Self Care | Payer: 59 | Attending: Emergency Medicine | Admitting: Emergency Medicine

## 2014-12-17 DIAGNOSIS — Z8659 Personal history of other mental and behavioral disorders: Secondary | ICD-10-CM | POA: Diagnosis not present

## 2014-12-17 DIAGNOSIS — Y998 Other external cause status: Secondary | ICD-10-CM | POA: Diagnosis not present

## 2014-12-17 DIAGNOSIS — W109XXA Fall (on) (from) unspecified stairs and steps, initial encounter: Secondary | ICD-10-CM | POA: Diagnosis not present

## 2014-12-17 DIAGNOSIS — M25562 Pain in left knee: Secondary | ICD-10-CM

## 2014-12-17 DIAGNOSIS — E669 Obesity, unspecified: Secondary | ICD-10-CM | POA: Diagnosis not present

## 2014-12-17 DIAGNOSIS — S8992XA Unspecified injury of left lower leg, initial encounter: Secondary | ICD-10-CM | POA: Insufficient documentation

## 2014-12-17 DIAGNOSIS — Z87891 Personal history of nicotine dependence: Secondary | ICD-10-CM | POA: Insufficient documentation

## 2014-12-17 DIAGNOSIS — Z7982 Long term (current) use of aspirin: Secondary | ICD-10-CM | POA: Insufficient documentation

## 2014-12-17 DIAGNOSIS — Y9389 Activity, other specified: Secondary | ICD-10-CM | POA: Diagnosis not present

## 2014-12-17 DIAGNOSIS — Y9289 Other specified places as the place of occurrence of the external cause: Secondary | ICD-10-CM | POA: Insufficient documentation

## 2014-12-17 MED ORDER — HYDROCODONE-ACETAMINOPHEN 5-325 MG PO TABS: 1.0000 | ORAL_TABLET | Freq: Four times a day (QID) | ORAL | Status: DC | PRN

## 2014-12-17 NOTE — ED Notes (Signed)
Ortho tech at bedside 

## 2014-12-17 NOTE — ED Notes (Signed)
Pt states that about week ago she tripped over her grandson's toys and fell.  Pt states that her left knee pain isnt getting any better.  Pt has tried wrapping the knee, putting creams on it but nothing helping with pain.

## 2014-12-17 NOTE — ED Provider Notes (Signed)
CSN: [id number]     Arrival date & time 12/17/14  1020 History   First MD Initiated Contact with Patient 12/17/14 1038     Chief Complaint  Patient presents with  . Knee Pain     (Consider location/radiation/quality/duration/timing/severity/associated sxs/prior Treatment) Patient is a 49 y.o. female presenting with knee pain. The history is provided by the patient. No language interpreter was used.  Knee Pain Location:  Knee Time since incident:  1 week Injury: yes   Mechanism of injury: fall   Fall:    Fall occurred:  Down stairs Knee location:  L knee Pain details:    Quality:  Aching   Radiates to:  Does not radiate   Severity:  Moderate   Onset quality:  Sudden   Duration:  1 week   Timing:  Intermittent   Progression:  Waxing and waning Chronicity:  New Dislocation: no   Associated symptoms: no fever     Past Medical History  Diagnosis Date  . Obesity   . Panic attacks    Past Surgical History  Procedure Laterality Date  . Partial hysterectomy     Family History  Problem Relation Age of Onset  . Osteoarthritis Mother   . Hypertension Father   . Hypertension Sister    History  Substance Use Topics  . Smoking status: Former Smoker    Types: Cigarettes    Quit date: 05/01/2013  . Smokeless tobacco: Never Used  . Alcohol Use: No   OB History    No data available     Review of Systems  Constitutional: Negative for fever and chills.  Respiratory: Negative for shortness of breath.   Cardiovascular: Negative for chest pain.  Gastrointestinal: Negative for nausea, vomiting, diarrhea and constipation.  Genitourinary: Negative for dysuria.  Musculoskeletal: Positive for arthralgias.      Allergies  Ibuprofen  Home Medications   Prior to Admission medications   Medication Sig Start Date End Date Taking? Authorizing Provider  acetaminophen (TYLENOL) 500 MG tablet Take 1 tablet (500 mg total) by mouth every 6 (six) hours as needed. Patient not  taking: Reported on 08/02/2014 06/18/14   Junius Finner, PA-C  aspirin 81 MG chewable tablet Chew 162 mg by mouth 2 (two) times daily.     Historical Provider, MD  benzonatate (TESSALON) 100 MG capsule Take 1 capsule (100 mg total) by mouth every 8 (eight) hours. Patient not taking: Reported on 08/02/2014 06/18/14   Junius Finner, PA-C  guaiFENesin (ROBITUSSIN) 100 MG/5ML liquid Take 5-10 mLs (100-200 mg total) by mouth every 4 (four) hours as needed for cough. Patient not taking: Reported on 08/02/2014 06/18/14   Junius Finner, PA-C  traMADol (ULTRAM) 50 MG tablet Take 1 tablet (50 mg total) by mouth every 6 (six) hours as needed. 08/02/14   Courtney Forcucci, PA-C   BP 130/89 mmHg  Pulse 100  Temp(Src) 98.1 F (36.7 C) (Oral)  Resp 18  SpO2 99% Physical Exam  Constitutional: She is oriented to person, place, and time. She appears well-developed and well-nourished.  HENT:  Head: Normocephalic and atraumatic.  Eyes: Conjunctivae and EOM are normal.  Neck: Normal range of motion.  Cardiovascular: Normal rate.   Pulmonary/Chest: Effort normal.  Abdominal: She exhibits no distension.  Musculoskeletal: Normal range of motion.  Left knee joint line tenderness on the medial and lateral aspect, no obvious swelling, no bony abnormality or deformity, range of motion strength 5/5, no obvious effusion, no evidence of septic joint or DVT, stability testing  limited secondary to body habitus  Neurological: She is alert and oriented to person, place, and time.  Skin: Skin is dry.  Psychiatric: She has a normal mood and affect. Her behavior is normal. Judgment and thought content normal.  Nursing note and vitals reviewed.   ED Course  Procedures (including critical care time) Labs Review Labs Reviewed - No data to display  Imaging Review Dg Knee Complete 4 Views Left  12/17/2014   CLINICAL DATA:  Tripped over a toy 1 week ago, persistent left knee pain  EXAM: LEFT KNEE - COMPLETE 4+ VIEW   COMPARISON:  None.  FINDINGS: Five views of the left knee submitted. No acute fracture or subluxation. No radiopaque foreign body. Mild narrowing of medial joint compartment. Mild narrowing of patellofemoral joint space. Small joint effusion.  IMPRESSION: No acute fracture or subluxation. Mild degenerative changes. Small joint effusion.   Electronically Signed   By: Natasha MeadLiviu  Pop M.D.   On: 12/17/2014 11:09     EKG Interpretation None      MDM   Final diagnoses:  Left knee pain    Patient with left knee pain. Suspect meniscus injury. Patient has clicking and popping with knee pain. She is able to ambulate. Will check plain films to rule out fracture. If negative, refer to orthopedics for soft tissue evaluation.   Plain films are negative. Discharge to home with orthopedic follow-up.  Will give knee immobilizer and crutches.   Roxy Horsemanobert Tauriel Scronce, PA-C 12/17/14 1135  Gerhard Munchobert Lockwood, MD 12/18/14 [phone]

## 2014-12-17 NOTE — Discharge Instructions (Signed)

## 2015-03-22 ENCOUNTER — Emergency Department (HOSPITAL_COMMUNITY)
Admission: EM | Admit: 2015-03-22 | Discharge: 2015-03-22 | Disposition: A | Discharge status: Home / Self Care | Payer: 59 | Attending: Emergency Medicine | Admitting: Emergency Medicine

## 2015-03-22 ENCOUNTER — Encounter (HOSPITAL_COMMUNITY): Payer: Self-pay | Admitting: Emergency Medicine

## 2015-03-22 ENCOUNTER — Emergency Department (HOSPITAL_COMMUNITY): Payer: 59

## 2015-03-22 DIAGNOSIS — Z87891 Personal history of nicotine dependence: Secondary | ICD-10-CM | POA: Insufficient documentation

## 2015-03-22 DIAGNOSIS — J40 Bronchitis, not specified as acute or chronic: Secondary | ICD-10-CM

## 2015-03-22 DIAGNOSIS — E669 Obesity, unspecified: Secondary | ICD-10-CM | POA: Insufficient documentation

## 2015-03-22 DIAGNOSIS — F410 Panic disorder [episodic paroxysmal anxiety] without agoraphobia: Secondary | ICD-10-CM | POA: Insufficient documentation

## 2015-03-22 DIAGNOSIS — J209 Acute bronchitis, unspecified: Secondary | ICD-10-CM | POA: Insufficient documentation

## 2015-03-22 DIAGNOSIS — R079 Chest pain, unspecified: Secondary | ICD-10-CM

## 2015-03-22 DIAGNOSIS — Z7982 Long term (current) use of aspirin: Secondary | ICD-10-CM | POA: Insufficient documentation

## 2015-03-22 LAB — COMPREHENSIVE METABOLIC PANEL
ALK PHOS: 47 U/L (ref 38–126)
ALT: 13 U/L — ABNORMAL LOW (ref 14–54)
AST: 16 U/L (ref 15–41)
Albumin: 3.8 g/dL (ref 3.5–5.0)
Anion gap: 8 (ref 5–15)
BILIRUBIN TOTAL: 0.3 mg/dL (ref 0.3–1.2)
BUN: 13 mg/dL (ref 6–20)
CALCIUM: 9.4 mg/dL (ref 8.9–10.3)
CHLORIDE: 107 mmol/L (ref 101–111)
CO2: 24 mmol/L (ref 22–32)
Creatinine, Ser: 0.69 mg/dL (ref 0.44–1.00)
GFR calc Af Amer: >60 mL/min (ref >60)
GFR calc non Af Amer: >60 mL/min (ref >60)
Glucose, Bld: 111 mg/dL — ABNORMAL HIGH (ref 65–99)
POTASSIUM: 4 mmol/L (ref 3.5–5.1)
SODIUM: 139 mmol/L (ref 135–145)
Total Protein: 7.7 g/dL (ref 6.5–8.1)

## 2015-03-22 LAB — CBC WITH DIFFERENTIAL/PLATELET
BASOS PCT: 0 % (ref 0–1)
Basophils Absolute: 0 10*3/uL (ref 0.0–0.1)
Eosinophils Absolute: 0.1 10*3/uL (ref 0.0–0.7)
Eosinophils Relative: 1 % (ref 0–5)
HEMATOCRIT: 37.2 % (ref 36.0–46.0)
HEMOGLOBIN: 12.2 g/dL (ref 12.0–15.0)
LYMPHS PCT: 39 % (ref 12–46)
Lymphs Abs: 2.5 10*3/uL (ref 0.7–4.0)
MCH: 28.6 pg (ref 26.0–34.0)
MCHC: 32.8 g/dL (ref 30.0–36.0)
MCV: 87.1 fL (ref 78.0–100.0)
Monocytes Absolute: 0.3 10*3/uL (ref 0.1–1.0)
Monocytes Relative: 5 % (ref 3–12)
Neutro Abs: 3.6 10*3/uL (ref 1.7–7.7)
Neutrophils Relative %: 55 % (ref 43–77)
PLATELETS: 291 10*3/uL (ref 150–400)
RBC: 4.27 MIL/uL (ref 3.87–5.11)
RDW: 13.8 % (ref 11.5–15.5)
WBC: 6.5 10*3/uL (ref 4.0–10.5)

## 2015-03-22 MED ORDER — AZITHROMYCIN 250 MG PO TABS: ORAL_TABLET | ORAL | Status: DC

## 2015-03-22 NOTE — ED Provider Notes (Signed)
CSN: [id number]     Arrival date & time 03/22/15  0805 History   First MD Initiated Contact with Patient 03/22/15 [phone]     Chief Complaint  Patient presents with  . Chest Pain     (Consider location/radiation/quality/duration/timing/severity/associated sxs/prior Treatment) Patient is a 49 y.o. female presenting with chest pain. The history is provided by the patient (the pt complains of a cough and chest aches for one week.).  Chest Pain Pain location:  Unable to specify Pain quality: aching   Pain radiates to:  Does not radiate Pain radiates to the back: no   Pain severity:  Moderate Onset quality:  Gradual Timing:  Intermittent Associated symptoms: cough   Associated symptoms: no abdominal pain, no back pain, no fatigue and no headache     Past Medical History  Diagnosis Date  . Obesity   . Panic attacks    Past Surgical History  Procedure Laterality Date  . Partial hysterectomy     Family History  Problem Relation Age of Onset  . Osteoarthritis Mother   . Hypertension Father   . Hypertension Sister    History  Substance Use Topics  . Smoking status: Former Smoker    Types: Cigarettes    Quit date: 05/01/2013  . Smokeless tobacco: Never Used  . Alcohol Use: No   OB History    No data available     Review of Systems  Constitutional: Negative for appetite change and fatigue.  HENT: Negative for congestion, ear discharge and sinus pressure.   Eyes: Negative for discharge.  Respiratory: Positive for cough.   Cardiovascular: Positive for chest pain.  Gastrointestinal: Negative for abdominal pain and diarrhea.  Genitourinary: Negative for frequency and hematuria.  Musculoskeletal: Negative for back pain.  Skin: Negative for rash.  Neurological: Negative for seizures and headaches.  Psychiatric/Behavioral: Negative for hallucinations.      Allergies  Ibuprofen  Home Medications   Prior to Admission medications   Medication Sig Start Date End Date  Taking? Authorizing Provider  aspirin 81 MG chewable tablet Chew 162 mg by mouth 2 (two) times daily.    Yes Historical Provider, MD  acetaminophen (TYLENOL) 500 MG tablet Take 1 tablet (500 mg total) by mouth every 6 (six) hours as needed. Patient not taking: Reported on 08/02/2014 06/18/14   Junius Finner, PA-C  azithromycin (ZITHROMAX Z-PAK) 250 MG tablet 2 po day one, then 1 daily x 4 days 03/22/15   Bethann Berkshire, MD  benzonatate (TESSALON) 100 MG capsule Take 1 capsule (100 mg total) by mouth every 8 (eight) hours. Patient not taking: Reported on 08/02/2014 06/18/14   Junius Finner, PA-C  guaiFENesin (ROBITUSSIN) 100 MG/5ML liquid Take 5-10 mLs (100-200 mg total) by mouth every 4 (four) hours as needed for cough. Patient not taking: Reported on 08/02/2014 06/18/14   Junius Finner, PA-C  HYDROcodone-acetaminophen (NORCO/VICODIN) 5-325 MG per tablet Take 1-2 tablets by mouth every 6 (six) hours as needed. Patient not taking: Reported on 03/22/2015 12/17/14   Roxy Horseman, PA-C  traMADol (ULTRAM) 50 MG tablet Take 1 tablet (50 mg total) by mouth every 6 (six) hours as needed. Patient not taking: Reported on 03/22/2015 08/02/14   Terri Piedra, PA-C   There were no vitals taken for this visit. Physical Exam  Constitutional: She is oriented to person, place, and time. She appears well-developed.  HENT:  Head: Normocephalic.  Eyes: Conjunctivae and EOM are normal. No scleral icterus.  Neck: Neck supple. No thyromegaly present.  Cardiovascular: Normal  rate and regular rhythm.  Exam reveals no gallop and no friction rub.   No murmur heard. Pulmonary/Chest: No stridor. She has no wheezes. She has no rales. She exhibits no tenderness.  Abdominal: She exhibits no distension. There is no tenderness. There is no rebound.  Musculoskeletal: Normal range of motion. She exhibits no edema.  Lymphadenopathy:    She has no cervical adenopathy.  Neurological: She is oriented to person, place, and time.  She exhibits normal muscle tone. Coordination normal.  Skin: No rash noted. No erythema.  Psychiatric: She has a normal mood and affect. Her behavior is normal.    ED Course  Procedures (including critical care time) Labs Review Labs Reviewed  COMPREHENSIVE METABOLIC PANEL - Abnormal; Notable for the following:    Glucose, Bld 111 (*)    ALT 13 (*)    All other components within normal limits  CBC WITH DIFFERENTIAL/PLATELET    Imaging Review Dg Chest Port 1 View  03/22/2015   CLINICAL DATA:  Chest pain.  EXAM: PORTABLE CHEST - 1 VIEW  COMPARISON:  August 02, 2014.  FINDINGS: The heart size and mediastinal contours are within normal limits. Both lungs are clear. No pneumothorax or pleural effusion is noted. The visualized skeletal structures are unremarkable.  IMPRESSION: No acute cardiopulmonary abnormality seen.   Electronically Signed   By: Lupita Raider, M.D.   On: 03/22/2015 08:45     EKG Interpretation   Date/Time:  Friday March 22 2015 08:15:12 EDT Ventricular Rate:  89 PR Interval:  142 QRS Duration: 71 QT Interval:  356 QTC Calculation: 433 R Axis:   47 Text Interpretation:  Sinus rhythm Confirmed by Alto Gandolfo  MD, Clotilda Hafer ([id number])  on 03/22/2015 10:20:40 AM      MDM   Final diagnoses:  Bronchitis    Bronchitis causing chest discomfort.   Nl ekg, doubt pe or cad.  rx with zpak and follow up     Bethann Berkshire, MD 03/22/15 1030

## 2015-03-22 NOTE — ED Notes (Signed)
Per pt, states chest pains on and off for about a week-SOB, congestion

## 2015-03-22 NOTE — Discharge Instructions (Signed)
Follow up with a family md in a week if not improving

## 2015-05-30 ENCOUNTER — Emergency Department (HOSPITAL_COMMUNITY)
Admission: EM | Admit: 2015-05-30 | Discharge: 2015-05-30 | Disposition: A | Discharge status: Home / Self Care | Payer: 59 | Attending: Emergency Medicine | Admitting: Emergency Medicine

## 2015-05-30 ENCOUNTER — Encounter (HOSPITAL_COMMUNITY): Payer: Self-pay

## 2015-05-30 DIAGNOSIS — E669 Obesity, unspecified: Secondary | ICD-10-CM | POA: Insufficient documentation

## 2015-05-30 DIAGNOSIS — M25562 Pain in left knee: Secondary | ICD-10-CM | POA: Insufficient documentation

## 2015-05-30 DIAGNOSIS — Z720 Tobacco use: Secondary | ICD-10-CM | POA: Insufficient documentation

## 2015-05-30 DIAGNOSIS — Z8659 Personal history of other mental and behavioral disorders: Secondary | ICD-10-CM | POA: Insufficient documentation

## 2015-05-30 DIAGNOSIS — Z7982 Long term (current) use of aspirin: Secondary | ICD-10-CM | POA: Insufficient documentation

## 2015-05-30 NOTE — ED Notes (Signed)
Pt c/o intermittent L knee pain and swelling since April.  Pain score 10/10.  Pt has not taken anything for pain.  Pt reports that she injured her knee in April and was seen at [facility], but she never followed up w/ Ortho.

## 2015-05-30 NOTE — ED Provider Notes (Signed)
CSN: [id number]     Arrival date & time 05/30/15  1403 History  By signing my name below, I, Freida Busman, attest that this documentation has been prepared under the direction and in the presence of non-physician practitioner, Jaynie Crumble, PA-C. Electronically Signed: Freida Busman, Scribe. 05/30/2015. 3:14 PM.  Chief Complaint  Patient presents with  . Knee Pain   The history is provided by the patient. No language interpreter was used.     HPI Comments:  Sara Arias is a 49 y.o. female who presents to the Emergency Department complaining of "stinging" left knee pain and moderate swelling for ~1 week. She denies recent injury/fall; states she has been experiencing the same pain intermittently since injuring the knee in April 2016 (fall). She has been wearing a knee brace, applying  muscle cream and taking ASA without relief. Patient is able to ambulate however states it's painful.  Past Medical History  Diagnosis Date  . Obesity   . Panic attacks    Past Surgical History  Procedure Laterality Date  . Partial hysterectomy     Family History  Problem Relation Age of Onset  . Osteoarthritis Mother   . Hypertension Father   . Hypertension Sister    Social History  Substance Use Topics  . Smoking status: Current Some Day Smoker    Types: Cigarettes  . Smokeless tobacco: Never Used  . Alcohol Use: No   OB History    No data available     Review of Systems  Constitutional: Negative for fever and chills.  Musculoskeletal: Positive for myalgias, joint swelling and arthralgias.   Allergies  Ibuprofen  Home Medications   Prior to Admission medications   Medication Sig Start Date End Date Taking? Authorizing Provider  aspirin 81 MG chewable tablet Chew 162 mg by mouth 2 (two) times daily.     Historical Provider, MD  azithromycin (ZITHROMAX Z-PAK) 250 MG tablet 2 po day one, then 1 daily x 4 days 03/22/15   Bethann Berkshire, MD  HYDROcodone-acetaminophen (NORCO/VICODIN)  5-325 MG per tablet Take 1-2 tablets by mouth every 6 (six) hours as needed. Patient not taking: Reported on 03/22/2015 12/17/14   Roxy Horseman, PA-C   BP 141/88 mmHg  Pulse 88  Temp(Src) 98.6 F (37 C) (Oral)  Resp 16  SpO2 97% Physical Exam  Constitutional: She is oriented to person, place, and time. She appears well-developed and well-nourished.  HENT:  Head: Normocephalic and atraumatic.  Cardiovascular: Normal rate.   Pulmonary/Chest: Effort normal.  Abdominal: She exhibits no distension.  Musculoskeletal:  Normal-appearing left knee. No obvious swelling. Patient able to flex her knee more than past 60. Full extension. Diffuse tenderness to palpation. Negative anterior posterior drawer signs.  no laxity and medial lateral stress. Dorsal pedal pulses intact.   Neurological: She is alert and oriented to person, place, and time.  Skin: Skin is warm and dry.  Psychiatric: She has a normal mood and affect.  Nursing note and vitals reviewed.   ED Course  Procedures   DIAGNOSTIC STUDIES:  Oxygen Saturation is 97% on RA, normal by my interpretation.    COORDINATION OF CARE:  3:10 PM Discussed treatment plan with pt at bedside and pt agreed to plan.  Labs Review Labs Reviewed - No data to display  Imaging Review No results found. I have personally reviewed and evaluated these images and lab results as part of my medical decision-making.   EKG Interpretation None      MDM  Final diagnoses:  Left knee pain     patient with left knee pain for 5 months. Has not followed up with orthopedist. X-rays unremarkable last visit. Joint is stable on exam today. No acute injuries. Home with NSAIDs, ice, elevation. Ace wrap follow-up with orthopedic specialist. Referral provided  Filed Vitals:   05/30/15 1421  BP: 141/88  Pulse: 88  Temp: 98.6 F (37 C)  TempSrc: Oral  Resp: 16  SpO2: 97%   I personally performed the services described in this documentation, which was  scribed in my presence. The recorded information has been reviewed and is accurate.   Jaynie Crumble, PA-C 05/30/15 1856  Leta Baptist, MD 06/01/15 [phone]

## 2015-05-30 NOTE — Discharge Instructions (Signed)
Tylenol and aspirin for pain. Keep knee elevated. Ice several times a day. ACE wrap or knee sleeve. Follow up with orthopedics.   Knee Pain The knee is the complex joint between your thigh and your lower leg. It is made up of bones, tendons, ligaments, and cartilage. The bones that make up the knee are:  The femur in the thigh.  The tibia and fibula in the lower leg.  The patella or kneecap riding in the groove on the lower femur. CAUSES  Knee pain is a common complaint with many causes. A few of these causes are:  Injury, such as:  A ruptured ligament or tendon injury.  Torn cartilage.  Medical conditions, such as:  Gout  Arthritis  Infections  Overuse, over training, or overdoing a physical activity. Knee pain can be minor or severe. Knee pain can accompany debilitating injury. Minor knee problems often respond well to self-care measures or get well on their own. More serious injuries may need medical intervention or even surgery. SYMPTOMS The knee is complex. Symptoms of knee problems can vary widely. Some of the problems are:  Pain with movement and weight bearing.  Swelling and tenderness.  Buckling of the knee.  Inability to straighten or extend your knee.  Your knee locks and you cannot straighten it.  Warmth and redness with pain and fever.  Deformity or dislocation of the kneecap. DIAGNOSIS  Determining what is wrong may be very straight forward such as when there is an injury. It can also be challenging because of the complexity of the knee. Tests to make a diagnosis may include:  Your caregiver taking a history and doing a physical exam.  Routine X-rays can be used to rule out other problems. X-rays will not reveal a cartilage tear. Some injuries of the knee can be diagnosed by:  Arthroscopy a surgical technique by which a small video camera is inserted through tiny incisions on the sides of the knee. This procedure is used to examine and repair internal  knee joint problems. Tiny instruments can be used during arthroscopy to repair the torn knee cartilage (meniscus).  Arthrography is a radiology technique. A contrast liquid is directly injected into the knee joint. Internal structures of the knee joint then become visible on X-ray film.  An MRI scan is a non X-ray radiology procedure in which magnetic fields and a computer produce two- or three-dimensional images of the inside of the knee. Cartilage tears are often visible using an MRI scanner. MRI scans have largely replaced arthrography in diagnosing cartilage tears of the knee.  Blood work.  Examination of the fluid that helps to lubricate the knee joint (synovial fluid). This is done by taking a sample out using a needle and a syringe. TREATMENT The treatment of knee problems depends on the cause. Some of these treatments are:  Depending on the injury, proper casting, splinting, surgery, or physical therapy care will be needed.  Give yourself adequate recovery time. Do not overuse your joints. If you begin to get sore during workout routines, back off. Slow down or do fewer repetitions.  For repetitive activities such as cycling or running, maintain your strength and nutrition.  Alternate muscle groups. For example, if you are a weight lifter, work the upper body on one day and the lower body the next.  Either tight or weak muscles do not give the proper support for your knee. Tight or weak muscles do not absorb the stress placed on the knee joint. Keep  the muscles surrounding the knee strong.  Take care of mechanical problems.  If you have flat feet, orthotics or special shoes may help. See your caregiver if you need help.  Arch supports, sometimes with wedges on the inner or outer aspect of the heel, can help. These can shift pressure away from the side of the knee most bothered by osteoarthritis.  A brace called an "unloader" brace also may be used to help ease the pressure on the  most arthritic side of the knee.  If your caregiver has prescribed crutches, braces, wraps or ice, use as directed. The acronym for this is PRICE. This means protection, rest, ice, compression, and elevation.  Nonsteroidal anti-inflammatory drugs (NSAIDs), can help relieve pain. But if taken immediately after an injury, they may actually increase swelling. Take NSAIDs with food in your stomach. Stop them if you develop stomach problems. Do not take these if you have a history of ulcers, stomach pain, or bleeding from the bowel. Do not take without your caregiver's approval if you have problems with fluid retention, heart failure, or kidney problems.  For ongoing knee problems, physical therapy may be helpful.  Glucosamine and chondroitin are over-the-counter dietary supplements. Both may help relieve the pain of osteoarthritis in the knee. These medicines are different from the usual anti-inflammatory drugs. Glucosamine may decrease the rate of cartilage destruction.  Injections of a corticosteroid drug into your knee joint may help reduce the symptoms of an arthritis flare-up. They may provide pain relief that lasts a few months. You may have to wait a few months between injections. The injections do have a small increased risk of infection, water retention, and elevated blood sugar levels.  Hyaluronic acid injected into damaged joints may ease pain and provide lubrication. These injections may work by reducing inflammation. A series of shots may give relief for as long as 6 months.  Topical painkillers. Applying certain ointments to your skin may help relieve the pain and stiffness of osteoarthritis. Ask your pharmacist for suggestions. Many over the-counter products are approved for temporary relief of arthritis pain.  In some countries, doctors often prescribe topical NSAIDs for relief of chronic conditions such as arthritis and tendinitis. A review of treatment with NSAID creams found that they  worked as well as oral medications but without the serious side effects. PREVENTION  Maintain a healthy weight. Extra pounds put more strain on your joints.  Get strong, stay limber. Weak muscles are a common cause of knee injuries. Stretching is important. Include flexibility exercises in your workouts.  Be smart about exercise. If you have osteoarthritis, chronic knee pain or recurring injuries, you may need to change the way you exercise. This does not mean you have to stop being active. If your knees ache after jogging or playing basketball, consider switching to swimming, water aerobics, or other low-impact activities, at least for a few days a week. Sometimes limiting high-impact activities will provide relief.  Make sure your shoes fit well. Choose footwear that is right for your sport.  Protect your knees. Use the proper gear for knee-sensitive activities. Use kneepads when playing volleyball or laying carpet. Buckle your seat belt every time you drive. Most shattered kneecaps occur in car accidents.  Rest when you are tired. SEEK MEDICAL CARE IF:  You have knee pain that is continual and does not seem to be getting better.  SEEK IMMEDIATE MEDICAL CARE IF:  Your knee joint feels hot to the touch and you have a high  fever. MAKE SURE YOU:   Understand these instructions.  Will watch your condition.  Will get help right away if you are not doing well or get worse. Document Released: 06/14/2007 Document Revised: 11/09/2011 Document Reviewed: 06/14/2007 [facility] Patient Information 2015 [place], [place]. This information is not intended to replace advice given to you by your health care provider. Make sure you discuss any questions you have with your health care provider.

## 2015-07-22 ENCOUNTER — Encounter (HOSPITAL_COMMUNITY): Payer: Self-pay

## 2015-07-22 ENCOUNTER — Emergency Department (HOSPITAL_COMMUNITY)
Admission: EM | Admit: 2015-07-22 | Discharge: 2015-07-23 | Disposition: A | Discharge status: Home / Self Care | Payer: 59 | Attending: Emergency Medicine | Admitting: Emergency Medicine

## 2015-07-22 ENCOUNTER — Emergency Department (HOSPITAL_COMMUNITY): Payer: 59

## 2015-07-22 DIAGNOSIS — J209 Acute bronchitis, unspecified: Secondary | ICD-10-CM

## 2015-07-22 DIAGNOSIS — Z7982 Long term (current) use of aspirin: Secondary | ICD-10-CM | POA: Insufficient documentation

## 2015-07-22 DIAGNOSIS — E669 Obesity, unspecified: Secondary | ICD-10-CM | POA: Insufficient documentation

## 2015-07-22 DIAGNOSIS — Z8659 Personal history of other mental and behavioral disorders: Secondary | ICD-10-CM | POA: Insufficient documentation

## 2015-07-22 DIAGNOSIS — R079 Chest pain, unspecified: Secondary | ICD-10-CM | POA: Insufficient documentation

## 2015-07-22 DIAGNOSIS — B9789 Other viral agents as the cause of diseases classified elsewhere: Secondary | ICD-10-CM

## 2015-07-22 DIAGNOSIS — J069 Acute upper respiratory infection, unspecified: Secondary | ICD-10-CM | POA: Insufficient documentation

## 2015-07-22 DIAGNOSIS — F17210 Nicotine dependence, cigarettes, uncomplicated: Secondary | ICD-10-CM | POA: Insufficient documentation

## 2015-07-22 DIAGNOSIS — Z79899 Other long term (current) drug therapy: Secondary | ICD-10-CM | POA: Insufficient documentation

## 2015-07-22 LAB — BASIC METABOLIC PANEL
ANION GAP: 8 (ref 5–15)
BUN: 12 mg/dL (ref 6–20)
CO2: 26 mmol/L (ref 22–32)
Calcium: 9.2 mg/dL (ref 8.9–10.3)
Chloride: 104 mmol/L (ref 101–111)
Creatinine, Ser: 0.82 mg/dL (ref 0.44–1.00)
GFR calc Af Amer: >60 mL/min (ref >60)
GFR calc non Af Amer: >60 mL/min (ref >60)
GLUCOSE: 152 mg/dL — AB (ref 65–99)
Potassium: 3.4 mmol/L — ABNORMAL LOW (ref 3.5–5.1)
Sodium: 138 mmol/L (ref 135–145)

## 2015-07-22 LAB — CBC
HCT: 39.9 % (ref 36.0–46.0)
HEMOGLOBIN: 12.9 g/dL (ref 12.0–15.0)
MCH: 28.5 pg (ref 26.0–34.0)
MCHC: 32.3 g/dL (ref 30.0–36.0)
MCV: 88.1 fL (ref 78.0–100.0)
Platelets: 306 10*3/uL (ref 150–400)
RBC: 4.53 MIL/uL (ref 3.87–5.11)
RDW: 13.8 % (ref 11.5–15.5)
WBC: 7.5 10*3/uL (ref 4.0–10.5)

## 2015-07-22 LAB — I-STAT TROPONIN, ED: TROPONIN I, POC: 0 ng/mL (ref 0.00–0.08)

## 2015-07-22 MED ORDER — ALBUTEROL SULFATE (2.5 MG/3ML) 0.083% IN NEBU
5.0000 mg | INHALATION_SOLUTION | Freq: Once | RESPIRATORY_TRACT | Status: AC
  Administered 2015-07-22: 5 mg via RESPIRATORY_TRACT
  Filled 2015-07-22: qty 6

## 2015-07-22 MED ORDER — POTASSIUM CHLORIDE 20 MEQ/15ML (10%) PO SOLN
40.0000 meq | Freq: Once | ORAL | Status: AC
  Administered 2015-07-22: 40 meq via ORAL
  Filled 2015-07-22: qty 30

## 2015-07-22 MED ORDER — AEROCHAMBER PLUS FLO-VU MEDIUM MISC
1.0000 | Freq: Once | Status: AC
  Administered 2015-07-23: 1
  Filled 2015-07-22 (×2): qty 1

## 2015-07-22 MED ORDER — IPRATROPIUM BROMIDE 0.02 % IN SOLN
0.5000 mg | Freq: Once | RESPIRATORY_TRACT | Status: AC
  Administered 2015-07-22: 0.5 mg via RESPIRATORY_TRACT
  Filled 2015-07-22: qty 2.5

## 2015-07-22 MED ORDER — ALBUTEROL SULFATE HFA 108 (90 BASE) MCG/ACT IN AERS
2.0000 | INHALATION_SPRAY | RESPIRATORY_TRACT | Status: DC | PRN
  Administered 2015-07-23: 2 via RESPIRATORY_TRACT
  Filled 2015-07-22: qty 6.7

## 2015-07-22 MED ORDER — BENZONATATE 100 MG PO CAPS: 100.0000 mg | ORAL_CAPSULE | Freq: Three times a day (TID) | ORAL | Status: DC

## 2015-07-22 MED ORDER — FLUTICASONE PROPIONATE 50 MCG/ACT NA SUSP: 2.0000 | Freq: Every day | NASAL | Status: DC

## 2015-07-22 MED ORDER — POTASSIUM CHLORIDE CRYS ER 20 MEQ PO TBCR
40.0000 meq | EXTENDED_RELEASE_TABLET | Freq: Once | ORAL | Status: DC
  Filled 2015-07-22: qty 2

## 2015-07-22 MED ORDER — ACETAMINOPHEN 160 MG/5ML PO SOLN
650.0000 mg | Freq: Once | ORAL | Status: AC
  Administered 2015-07-22: 650 mg via ORAL
  Filled 2015-07-22: qty 20.3

## 2015-07-22 NOTE — Discharge Instructions (Signed)
1. Medications: flonase, mucinex, tessalon, albuterol, usual home medications 2. Treatment: rest, drink plenty of fluids, take tylenol or ibuprofen for fever control 3. Follow Up: Please followup with your primary doctor in 3 days for discussion of your diagnoses and further evaluation after today's visit; if you do not have a primary care doctor use the resource guide provided to find one; Return to the ER for high fevers, difficulty breathing or other concerning symptoms     Acute Bronchitis Bronchitis is inflammation of the airways that extend from the windpipe into the lungs (bronchi). The inflammation often causes mucus to develop. This leads to a cough, which is the most common symptom of bronchitis.  In acute bronchitis, the condition usually develops suddenly and goes away over time, usually in a couple weeks. Smoking, allergies, and asthma can make bronchitis worse. Repeated episodes of bronchitis may cause further lung problems.  CAUSES Acute bronchitis is most often caused by the same virus that causes a cold. The virus can spread from person to person (contagious) through coughing, sneezing, and touching contaminated objects. SIGNS AND SYMPTOMS   Cough.   Fever.   Coughing up mucus.   Body aches.   Chest congestion.   Chills.   Shortness of breath.   Sore throat.  DIAGNOSIS  Acute bronchitis is usually diagnosed through a physical exam. Your health care provider will also ask you questions about your medical history. Tests, such as chest X-rays, are sometimes done to rule out other conditions.  TREATMENT  Acute bronchitis usually goes away in a couple weeks. Oftentimes, no medical treatment is necessary. Medicines are sometimes given for relief of fever or cough. Antibiotic medicines are usually not needed but may be prescribed in certain situations. In some cases, an inhaler may be recommended to help reduce shortness of breath and control the cough. A cool mist  vaporizer may also be used to help thin bronchial secretions and make it easier to clear the chest.  HOME CARE INSTRUCTIONS  Get plenty of rest.   Drink enough fluids to keep your urine clear or pale yellow (unless you have a medical condition that requires fluid restriction). Increasing fluids may help thin your respiratory secretions (sputum) and reduce chest congestion, and it will prevent dehydration.   Take medicines only as directed by your health care provider.  If you were prescribed an antibiotic medicine, finish it all even if you start to feel better.  Avoid smoking and secondhand smoke. Exposure to cigarette smoke or irritating chemicals will make bronchitis worse. If you are a smoker, consider using nicotine gum or skin patches to help control withdrawal symptoms. Quitting smoking will help your lungs heal faster.   Reduce the chances of another bout of acute bronchitis by washing your hands frequently, avoiding people with cold symptoms, and trying not to touch your hands to your mouth, nose, or eyes.   Keep all follow-up visits as directed by your health care provider.  SEEK MEDICAL CARE IF: Your symptoms do not improve after 1 week of treatment.  SEEK IMMEDIATE MEDICAL CARE IF:  You develop an increased fever or chills.   You have chest pain.   You have severe shortness of breath.  You have bloody sputum.   You develop dehydration.  You faint or repeatedly feel like you are going to pass out.  You develop repeated vomiting.  You develop a severe headache. MAKE SURE YOU:   Understand these instructions.  Will watch your condition.  Will get  help right away if you are not doing well or get worse.   This information is not intended to replace advice given to you by your health care provider. Make sure you discuss any questions you have with your health care provider.   Document Released: 09/24/2004 Document Revised: 09/07/2014 Document Reviewed:  02/07/2013 Elsevier Interactive Patient Education 2016 [employer].    Emergency Department Resource Guide 1) Find a Doctor and Pay Out of Pocket Although you won't have to find out who is covered by your insurance plan, it is a good idea to ask around and get recommendations. You will then need to call the office and see if the doctor you have chosen will accept you as a new patient and what types of options they offer for patients who are self-pay. Some doctors offer discounts or will set up payment plans for their patients who do not have insurance, but you will need to ask so you aren't surprised when you get to your appointment.  2) Contact Your Local Health Department Not all health departments have doctors that can see patients for sick visits, but many do, so it is worth a call to see if yours does. If you don't know where your local health department is, you can check in your phone book. The CDC also has a tool to help you locate your state's health department, and many state websites also have listings of all of their local health departments.  3) Find a Walk-in Clinic If your illness is not likely to be very severe or complicated, you may want to try a walk in clinic. These are popping up all over the country in pharmacies, drugstores, and shopping centers. They're usually staffed by nurse practitioners or physician assistants that have been trained to treat common illnesses and complaints. They're usually fairly quick and inexpensive. However, if you have serious medical issues or chronic medical problems, these are probably not your best option.  No Primary Care Doctor: - Call Health Connect at  [phone] - they can help you locate a primary care doctor that  accepts your insurance, provides certain services, etc. - Physician Referral Service- [phone]  Chronic Pain Problems: Organization         Address  Phone   Notes  [facility]  [phone] Patients  need to be referred by their primary care doctor.   Medication Assistance: Organization         Address  Phone   Notes  [facility] [address] [place], [place] [address] [phone] --Must be a resident of [facility] -- Must have NO insurance coverage whatsoever (no Medicaid/ Medicare, etc.) -- The pt. MUST have a primary care doctor that directs their care regularly and follows them in the community   [facility]  [phone]   [employer]  [phone]    Agencies that provide inexpensive medical care: Organization         Address  Phone   Notes  [facility]  [phone]   [facility]    [phone]   [facility] [address] [place], [place] [address] [phone]   [facility] [address] [place], [place] [phone]   Planned Parenthood    [phone]   [facility]    [phone]   Community Health and [facility]  [address] [place], [place]  Phone:  [phone], Fax:  [phone] Hours of Operation:  9 am - 6 pm, M-F.  Also accepts Medicaid/Medicare and self-pay.  [facility]  [address], [place] Phone: [phone], Fax: [phone]. Hours of Operation:  8:30 am - 5:30 pm, M-F.  Also accepts Medicaid and self-pay.  [facility] [place] [address], [place] Phone: [phone]   [facility] [address] Natasha Bence [place], [place] [phone], Ext. [phone] Mondays & Thursdays: 7-9 AM.  First 15 patients are seen on a first come, first serve basis.    Medicaid-accepting [facility] Providers:  Organization         Address  Phone   Notes  [facility] [address], [place] [phone] Also accepts self-pay patients.  [facility] [address] Laurell Josephs [place], [place]  [phone]     [facility] [address], [place] [phone]   [facility] [address], [place] [phone]   Renaye Rakers [address], [place]   [phone] Only accepts [place] Access [place] patients after they have their name applied to their card.   Self-Pay (no insurance) in [facility]:  Organization         Address  Phone   Notes  Sickle Cell Patients, [facility] [address] [place], [place] [phone]   [facility] [address] [place], [place] [phone]   [facility]  [address], [place] [phone]   [facility]/Dr. Osei-Bonsu  [address] [place] or [address], [place] [phone] Phone number for both [place] and [place] locations is the same.  [facility]gent [facility] and [facility] [address] [place] [phone]   [facility] [address], [place] or [address] [phone]   [facility] [address] [place] [phone], phone; [phone], fax Sees patients 1st and 3rd Saturday of every month.  Must not qualify for public or private insurance (i.e. Medicaid, Medicare, [place] Health Choice, Veterans' Benefits)  Household income should be no more than 200% of the poverty level The clinic cannot treat you if you are pregnant or think you are pregnant  Sexually transmitted diseases are not treated at the clinic.    Dental Care: Organization         Address  Phone  Notes  [facility] [address] [place], [place] [phone] Accepts children up to age 74 who are enrolled in [place] or [place] Health Choice; pregnant women with a Medicaid card; and children who have applied for Medicaid or [place] Health Choice, but were declined,  whose parents can pay a reduced fee at time of service.  [facility]  [address], [place] [phone] Accepts children up to age 2 who are enrolled in [place] or [place] Health Choice; pregnant women with a Medicaid card; and children who have applied for Medicaid or [place] Health Choice, but were declined, whose parents can pay a reduced fee at time of service.  [facility]  [address] [place], [place] [phone] Patients are seen by  appointment only. Walk-ins are not accepted. [facility] will see patients 51 years of age and older. Monday - Tuesday (8am-5pm) Most Wednesdays (8:30-5pm) $30 per visit, cash only  [facility]  [address], [facility] [phone] Patients are seen by appointment only. Walk-ins are not accepted. [facility] will see patients 51 years of age and older. One Wednesday Evening (Monthly: Volunteer Based).  $30 per visit, cash only  [facility]  [phone] for adults; Children under age 774, call Graduate Pediatric Dentistry at [phone]. Children aged 594-14, please call [phone] to request a pediatric application.  Dental services are provided in all areas of dental care including fillings, crowns and bridges, complete and partial dentures, implants, gum treatment, root canals, and extractions. Preventive care is also provided. Treatment is provided to both adults and children. Patients are selected via a lottery and there is often a waiting list.   [facility] [address], [place]  [phone] [email or web address]   [facility] [address], [place], [place] [phone], Ext. [phone] Second and Fourth Thursday of each month, opens at 6:30 AM; Clinic ends at 9 AM.  Patients are seen on a first-come first-served basis, and a limited number are seen during each clinic.   [facility]  [address], [place], [place] [phone]) [phone]   Eligibility Requirements You must have lived in [place], [place], or [place] counties for at least the last three months.   You cannot be eligible for state or federal sponsored National Cityhealthcare insurance, including CIGNAVeterans Administration, [place]icaid, or [employer]icare.   You generally cannot be eligible for healthcare insurance through your employer.    How to apply: Eligibility screenings are held every Tuesday and Wednesday afternoon from 1:00 pm until 4:00 pm. You do not need an appointment for the interview!  [facility] [address], [place], [place] [phone]   [facility]  [phone]   [facility]  [phone]   [facility]  [phone]    Behavioral Health Resources in the Community: Intensive Outpatient Programs Organization         Address  Phone  Notes  [facility] [address], [place], [place] [phone]   [facility] [address], [place], [place] [phone]   [facility] [address], [place], [place]  [phone]   [facility] [address],  [place], [place] [phone] or [phone]   Substance Abuse Resources Organization         Address  Phone  Notes  [facility]  [phone]   [facility]  [phone]   [facility]  [phone]   [facility]  [phone]   Residential & Outpatient Substance Abuse Program  [phone]   Psychological Services Organization         Address  Phone  Notes  [facility]  [phone]- [phone]   [facility]  [phone]- [phone]   [facility] [address], [place] [phone] or [phone]    Mobile Crisis Teams Organization         Address  Phone  Notes  [facility], Mobile Crisis Care Unit   [phone]   [facility]  [address] [place], [place] [phone]   [facility] [address] [place] [phone]  Self-Help/Support Groups Organization         Address  Phone             Notes  [facility] [place] - variety of support groups  [place] Call for more information  Narcotics Anonymous (NA), [facility] [address], [employer] [place]  2 meetings at this location   Special educational needs teacher         Address  Phone  Notes  [facility] [place],    [place]  [phone]   [facility]  [address] [place] [address], [place], [place]   [place], [place] [phone] Admissions: 8am-3pm M-F  Incentives Substance [place] [address],    [place], [place] [phone]   [facility] [address] [place], [place], [place]   [facility] [address]  [place], [place]   [facility] - Intensive Outpatient [address] Kristeen [address], [place], [place]   [facility] [address]  [place], [place] [phone] or [phone]   [facility] (RTS) [address], [place], [place] Accepts Medicaid  [facility] [address]  [place] [phone] Substance Abuse/Addiction Treatment   [facility] Organization         Address  Phone  Notes  [facility]  [phone]   Domenic Schwab, PhD [address] Arlis [place], [place]   [phone] or [phone]   [place], [place] [phone]   [facility] [address] [place], [place] [phone] Insurance/Medicaid/sponsorship through [facility] and Families [address]                                     [place], [place] [phone] [place] [address] [place], [place] [phone]    Dr. Adele Schilder  [phone]   [facility] Dept. 1) [address], [place] 2) [place] 3)  [place] [address], [place] [phone]  [phone]   [place] [phone] or [phone] (After Hours)

## 2015-07-22 NOTE — ED Provider Notes (Signed)
CSN: [id number]     Arrival date & time 07/22/15  1842 History  By signing my name below, I, Octavia Heir, attest that this documentation has been prepared under the direction and in the presence of [employer], PA-C. Electronically Signed: Octavia Heir, ED Scribe. 07/22/2015. 10:27 PM.     Chief Complaint  Patient presents with  . Chest Pain  . Cough      The history is provided by the patient and medical records. No language interpreter was used.   HPI Comments: Sara Arias is a 49 y.o. female who presents to the Emergency Department complaining of constant, gradual worsening generalized chest pain onset one week ago. She describes the pain as sharp and states the pain does not radiate. She has been having an associated cough, rhinorrhea, and sore throat onset two days ago. 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 Patient does report sick contacts at her job. Pt reports using OTC pain patches to alleviate the chest pain with no relief. She denies family hx of death before age 21 due to heart problems, vomiting, rash, bilateral ear pain, swelling in legs, long car rides, shortness of breath, and fever, history of DVT. Pt is not on birth control.  Past Medical History  Diagnosis Date  . Obesity   . Panic attacks    Past Surgical History  Procedure Laterality Date  . Partial hysterectomy     Family History  Problem Relation Age of Onset  . Osteoarthritis Mother   . Hypertension Father   . Hypertension Sister    Social History  Substance Use Topics  . Smoking status: Current Some Day Smoker    Types: Cigarettes  . Smokeless tobacco: Never Used  . Alcohol Use: No   OB History    No data available     Review of Systems  Constitutional: Positive for chills. Negative for fever, diaphoresis, appetite change, fatigue and unexpected weight change.  HENT: Positive for congestion, postnasal drip, rhinorrhea, sinus pressure and  sore throat. Negative for ear pain and mouth sores.   Eyes: Negative for visual disturbance.  Respiratory: Positive for cough. Negative for chest tightness, shortness of breath and wheezing.   Cardiovascular: Positive for chest pain.  Gastrointestinal: Negative for nausea, vomiting, abdominal pain, diarrhea and constipation.  Endocrine: Negative for polydipsia, polyphagia and polyuria.  Genitourinary: Negative for dysuria, urgency, frequency and hematuria.  Musculoskeletal: Negative for back pain and neck stiffness.  Skin: Negative for rash.  Allergic/Immunologic: Negative for immunocompromised state.  Neurological: Negative for syncope, light-headedness and headaches.  Hematological: Does not bruise/bleed easily.  Psychiatric/Behavioral: Negative for sleep disturbance. The patient is not nervous/anxious.   All other systems reviewed and are negative.     Allergies  Ibuprofen  Home Medications   Prior to Admission medications   Medication Sig Start Date End Date Taking? Authorizing Provider  aspirin 81 MG chewable tablet Chew 162-243 mg by mouth as needed for mild pain or headache.    Yes Historical Provider, MD  Multiple Vitamin (MULTIVITAMIN WITH MINERALS) TABS tablet Take 1 tablet by mouth daily.   Yes Historical Provider, MD  benzonatate (TESSALON) 100 MG capsule Take 1 capsule (100 mg total) by mouth every 8 (eight) hours. 07/22/15   Rohaan Durnil, PA-C  fluticasone (FLONASE) 50 MCG/ACT nasal spray Place 2 sprays into both nostrils daily. 07/22/15   Sabian Kuba, PA-C   Triage vitals: BP 135/91 mmHg  Pulse 93  Temp(Src) 98 F (  36.7 C) (Oral)  Resp 16  SpO2 100% Physical Exam  Constitutional: She is oriented to person, place, and time. She appears well-developed and well-nourished. No distress.  Awake, alert, nontoxic appearance  HENT:  Head: Normocephalic and atraumatic.  Right Ear: Tympanic membrane, external ear and ear canal normal.  Left Ear: Tympanic  membrane, external ear and ear canal normal.  Nose: Mucosal edema and rhinorrhea present. No epistaxis. Right sinus exhibits no maxillary sinus tenderness and no frontal sinus tenderness. Left sinus exhibits no maxillary sinus tenderness and no frontal sinus tenderness.  Mouth/Throat: Uvula is midline, oropharynx is clear and moist and mucous membranes are normal. Mucous membranes are not pale and not cyanotic. No oropharyngeal exudate, posterior oropharyngeal edema, posterior oropharyngeal erythema or tonsillar abscesses.  Eyes: Conjunctivae are normal. Pupils are equal, round, and reactive to light. No scleral icterus.  Neck: Normal range of motion and full passive range of motion without pain. Neck supple.  Cardiovascular: Normal rate, regular rhythm, normal heart sounds and intact distal pulses.   Pulmonary/Chest: Effort normal. No stridor. No respiratory distress. She has wheezes. She exhibits tenderness.  Equal chest expansion Scattered expiratory wheezes throughout No accessory muscle usage or retractions Significant tenderness to palpation of the anterior chest and bilateral ribs  Abdominal: Soft. Bowel sounds are normal. She exhibits no distension and no mass. There is no tenderness. There is no rebound and no guarding.  Musculoskeletal: Normal range of motion. She exhibits no edema.  Lymphadenopathy:    She has no cervical adenopathy.  Neurological: She is alert and oriented to person, place, and time.  Speech is clear and goal oriented Moves extremities without ataxia  Skin: Skin is warm and dry. No rash noted. She is not diaphoretic. No erythema.  Psychiatric: She has a normal mood and affect.  Nursing note and vitals reviewed.   ED Course  Procedures  DIAGNOSTIC STUDIES: Oxygen Saturation is 100% on RA, normal by my interpretation.  COORDINATION OF CARE:  10:19 PM Discussed treatment plan which includes anti-inflammatory and albuteral with pt at bedside and pt agreed to  plan.  Labs Review Labs Reviewed  BASIC METABOLIC PANEL - Abnormal; Notable for the following:    Potassium 3.4 (*)    Glucose, Bld 152 (*)    All other components within normal limits  CBC  I-STAT TROPOININ, ED    Imaging Review Dg Chest 2 View  07/22/2015  CLINICAL DATA:  49 year old female with 1 week history of bilateral chest pain, productive cough and shortness of breath EXAM: CHEST  2 VIEW COMPARISON:  Prior chest x-ray 03/22/2015 FINDINGS: The lungs are clear and negative for focal airspace consolidation, pulmonary edema or suspicious pulmonary nodule. No pleural effusion or pneumothorax. Cardiac and mediastinal contours are within normal limits. No acute fracture or lytic or blastic osseous lesions. The visualized upper abdominal bowel gas pattern is unremarkable. IMPRESSION: Negative chest x-ray. Electronically Signed   By: Malachy MoanHeath  McCullough M.D.   On: 07/22/2015 20:25   I have personally reviewed and evaluated these images and lab results as part of my medical decision-making.   ED ECG REPORT   Date: 07/23/2015  Rate: 95  Rhythm: normal sinus rhythm  QRS Axis: normal  Intervals: normal  ST/T Wave abnormalities: nonspecific T wave changes  Conduction Disutrbances:none  Narrative Interpretation: mild T wave flattening noted in the lateral leads  Old EKG Reviewed: changes noted  I have personally reviewed the EKG tracing and agree with the computerized printout as noted.  MDM   Final diagnoses:  Chest pain, unspecified chest pain type  Viral URI with cough  Bronchospasm with bronchitis, acute   Tim Lair Tickner presents with cough, URI and anterior chest pain. Patient is a smoker and she is wheezing.  Chest pain is not likely of cardiac or pulmonary etiology d/t presentation, PERC negative, VSS, no tracheal deviation, no JVD or new murmur, RRR, breath sounds equal bilaterally, EKG without T wave inversions or ST depression, negative troponin, and negative CXR. Patient  chest pain is likely pleuritic and chest wall in nature as it is reproducible. Patient reports significant improvement in her chest pain after albuterol administration. Wheezing resolves. Pt has been advised to return to the ED if CP becomes exertional, associated with diaphoresis or nausea, radiates to left jaw/arm, worsens or becomes concerning in any way. Pt appears reliable for follow up and is agreeable to discharge.   BP 129/76 mmHg  Pulse 85  Temp(Src) 98 F (36.7 C) (Oral)  Resp 20  SpO2 100%    Dierdre Forth, PA-C 07/23/15 0606  Benjiman Core, MD 07/29/15 [id number]

## 2015-07-22 NOTE — ED Notes (Signed)
Pt c/o generalized chest pain x 1 week and cough x 1-2 days.  Pain score 10/10.  Pt reports taking aspirin w/o relief.  Denies associated symptoms.

## 2015-07-23 NOTE — ED Notes (Signed)
Patient was alert, oriented and stable upon discharge. RN went over AVS and patient had no further questions.  

## 2015-11-23 ENCOUNTER — Emergency Department (HOSPITAL_COMMUNITY): Payer: Self-pay

## 2015-11-23 ENCOUNTER — Emergency Department (HOSPITAL_COMMUNITY)
Admission: EM | Admit: 2015-11-23 | Discharge: 2015-11-23 | Disposition: A | Discharge status: Home / Self Care | Payer: Self-pay | Attending: Emergency Medicine | Admitting: Emergency Medicine

## 2015-11-23 ENCOUNTER — Encounter (HOSPITAL_COMMUNITY): Payer: Self-pay | Admitting: Emergency Medicine

## 2015-11-23 DIAGNOSIS — J111 Influenza due to unidentified influenza virus with other respiratory manifestations: Secondary | ICD-10-CM

## 2015-11-23 DIAGNOSIS — F17210 Nicotine dependence, cigarettes, uncomplicated: Secondary | ICD-10-CM | POA: Insufficient documentation

## 2015-11-23 DIAGNOSIS — J1108 Influenza due to unidentified influenza virus with specified pneumonia: Secondary | ICD-10-CM | POA: Insufficient documentation

## 2015-11-23 DIAGNOSIS — J159 Unspecified bacterial pneumonia: Secondary | ICD-10-CM | POA: Insufficient documentation

## 2015-11-23 DIAGNOSIS — E669 Obesity, unspecified: Secondary | ICD-10-CM | POA: Insufficient documentation

## 2015-11-23 DIAGNOSIS — Z8659 Personal history of other mental and behavioral disorders: Secondary | ICD-10-CM | POA: Insufficient documentation

## 2015-11-23 DIAGNOSIS — Z7951 Long term (current) use of inhaled steroids: Secondary | ICD-10-CM | POA: Insufficient documentation

## 2015-11-23 DIAGNOSIS — Z79899 Other long term (current) drug therapy: Secondary | ICD-10-CM | POA: Insufficient documentation

## 2015-11-23 DIAGNOSIS — J189 Pneumonia, unspecified organism: Secondary | ICD-10-CM

## 2015-11-23 DIAGNOSIS — Z7982 Long term (current) use of aspirin: Secondary | ICD-10-CM | POA: Insufficient documentation

## 2015-11-23 DIAGNOSIS — R69 Illness, unspecified: Secondary | ICD-10-CM

## 2015-11-23 MED ORDER — PSEUDOEPHEDRINE HCL 60 MG PO TABS
60.0000 mg | ORAL_TABLET | Freq: Once | ORAL | Status: AC
  Administered 2015-11-23: 60 mg via ORAL
  Filled 2015-11-23: qty 1

## 2015-11-23 MED ORDER — CEPHALEXIN 500 MG PO CAPS: 500.0000 mg | ORAL_CAPSULE | Freq: Four times a day (QID) | ORAL | Status: DC

## 2015-11-23 MED ORDER — ACETAMINOPHEN 500 MG PO TABS
1000.0000 mg | ORAL_TABLET | Freq: Once | ORAL | Status: AC
  Administered 2015-11-23: 1000 mg via ORAL
  Filled 2015-11-23: qty 2

## 2015-11-23 MED ORDER — AZITHROMYCIN 250 MG PO TABS: 250.0000 mg | ORAL_TABLET | Freq: Every day | ORAL | Status: DC

## 2015-11-23 MED ORDER — NAPROXEN 500 MG PO TABS
500.0000 mg | ORAL_TABLET | Freq: Once | ORAL | Status: AC
  Administered 2015-11-23: 500 mg via ORAL
  Filled 2015-11-23: qty 1

## 2015-11-23 NOTE — ED Provider Notes (Addendum)
CSN: [id number]     Arrival date & time 11/23/15  0815 History   First MD Initiated Contact with Patient 11/23/15 [phone]     Chief Complaint  Patient presents with  . URI     (Consider location/radiation/quality/duration/timing/severity/associated sxs/prior Treatment) Patient is a 50 y.o. female presenting with general illness. The history is provided by the patient.  Illness Severity:  Mild Onset quality:  Gradual Duration:  1 week Timing:  Constant Progression:  Worsening Chronicity:  New Associated symptoms: congestion, cough, fever, myalgias and shortness of breath   Associated symptoms: no chest pain, no headaches, no nausea, no rhinorrhea, no vomiting and no wheezing    50 yo F With a chief complaint of cough, congestion, fevers, myalgias, chills. This been going on for about a week. 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 Feel like her symptoms have been getting worse. She's been having trouble breathing with this. Having some chest pain with coughing.  Past Medical History  Diagnosis Date  . Obesity   . Panic attacks    Past Surgical History  Procedure Laterality Date  . Partial hysterectomy     Family History  Problem Relation Age of Onset  . Osteoarthritis Mother   . Hypertension Father   . Hypertension Sister    Social History  Substance Use Topics  . Smoking status: Current Some Day Smoker    Types: Cigarettes  . Smokeless tobacco: Never Used  . Alcohol Use: No   OB History    No data available     Review of Systems  Constitutional: Positive for fever and chills.  HENT: Positive for congestion. Negative for rhinorrhea.   Eyes: Negative for redness and visual disturbance.  Respiratory: Positive for cough and shortness of breath. Negative for wheezing.   Cardiovascular: Negative for chest pain and palpitations.  Gastrointestinal: Negative for nausea and vomiting.  Genitourinary: Negative for dysuria and  urgency.  Musculoskeletal: Positive for myalgias. Negative for arthralgias.  Skin: Negative for pallor and wound.  Neurological: Negative for dizziness and headaches.      Allergies  Ibuprofen  Home Medications   Prior to Admission medications   Medication Sig Start Date End Date Taking? Authorizing Provider  aspirin 81 MG chewable tablet Chew 162-243 mg by mouth as needed for mild pain or headache.     Historical Provider, MD  azithromycin (ZITHROMAX) 250 MG tablet Take 1 tablet (250 mg total) by mouth daily. Take first 2 tablets together, then 1 every day until finished. 11/23/15   Melene Plan, DO  benzonatate (TESSALON) 100 MG capsule Take 1 capsule (100 mg total) by mouth every 8 (eight) hours. 07/22/15   Hannah Muthersbaugh, PA-C  cephALEXin (KEFLEX) 500 MG capsule Take 1 capsule (500 mg total) by mouth 4 (four) times daily. 11/23/15   Melene Plan, DO  fluticasone (FLONASE) 50 MCG/ACT nasal spray Place 2 sprays into both nostrils daily. 07/22/15   Hannah Muthersbaugh, PA-C  Multiple Vitamin (MULTIVITAMIN WITH MINERALS) TABS tablet Take 1 tablet by mouth daily.    Historical Provider, MD   BP 163/60 mmHg  Pulse 86  Temp(Src) 98.4 F (36.9 C) (Oral)  Resp 18  SpO2 98% Physical Exam  Constitutional: She is oriented to person, place, and time. She appears well-developed and well-nourished. No distress.  HENT:  Head: Normocephalic and atraumatic.  Swollen turbinates, posterior nasal drip, no noted sinus ttp, tm normal bilaterally.    Eyes: EOM are normal. Pupils  are equal, round, and reactive to light.  Neck: Normal range of motion. Neck supple.  Cardiovascular: Normal rate and regular rhythm.  Exam reveals no gallop and no friction rub.   No murmur heard. Pulmonary/Chest: Effort normal. She has no wheezes. She has no rales.  Abdominal: Soft. She exhibits no distension. There is no tenderness. There is no rebound and no guarding.  Musculoskeletal: She exhibits no edema or  tenderness.  Neurological: She is alert and oriented to person, place, and time.  Skin: Skin is warm and dry. She is not diaphoretic.  Psychiatric: She has a normal mood and affect. Her behavior is normal.  Nursing note and vitals reviewed.   ED Course  Procedures (including critical care time) Labs Review Labs Reviewed - No data to display  Imaging Review Dg Chest 2 View  11/23/2015  CLINICAL DATA:  50 year old female with a history of right upper chest pain and cough EXAM: CHEST - 2 VIEW COMPARISON:  07/22/2015, 03/22/2015 FINDINGS: Cardiomediastinal silhouette unchanged. Airspace opacity at the posterior base, with questionable airspace opacity in the retrocardiac region and at the right base on the anterior view. No displaced fracture. Unremarkable appearance of the upper abdomen. IMPRESSION: Airspace opacity at the base of the lungs, compatible with pneumonia. Signed, Yvone NeuJaime S. Loreta AveWagner, DO [facility] Electronically Signed   By: Gilmer MorJaime  Wagner D.O.   On: 11/23/2015 09:45   I have personally reviewed and evaluated these images and lab results as part of my medical decision-making.   EKG Interpretation None      MDM   Final diagnoses:  Influenza-like illness  CAP (community acquired pneumonia)    50 yo F With a chief complaint of flulike symptoms. Will obtain a chest x-ray to rule out pneumonia. Discussed with the patient conservative treatment. PCP follow-up.  CXR with pna, start on abx.  PCP follow up.  10:09 AM:  I have discussed the diagnosis/risks/treatment options with the patient and family and believe the pt to be eligible for discharge home to follow-up with PCP. We also discussed returning to the ED immediately if new or worsening sx occur. We discussed the sx which are most concerning (e.g., sudden worsening pain, ams, sob, inability to tolerate by mouth ) that necessitate immediate return. Medications  administered to the patient during their visit and any new prescriptions provided to the patient are listed below.  Medications given during this visit Medications  acetaminophen (TYLENOL) tablet 1,000 mg (1,000 mg Oral Given 11/23/15 0858)  naproxen (NAPROSYN) tablet 500 mg (500 mg Oral Given 11/23/15 0919)  pseudoephedrine (SUDAFED) tablet 60 mg (60 mg Oral Given 11/23/15 0919)    New Prescriptions   AZITHROMYCIN (ZITHROMAX) 250 MG TABLET    Take 1 tablet (250 mg total) by mouth daily. Take first 2 tablets together, then 1 every day until finished.   CEPHALEXIN (KEFLEX) 500 MG CAPSULE    Take 1 capsule (500 mg total) by mouth 4 (four) times daily.    The patient appears reasonably screen and/or stabilized for discharge and I doubt any other medical condition or other [facility] requiring further screening, evaluation, or treatment in the ED at this time prior to discharge.     Melene Planan Miko Sirico, DO 11/23/15 1009  Melene Planan Vanassa Penniman, DO 11/23/15 1009

## 2015-11-23 NOTE — ED Notes (Signed)
Patient transported to X-ray 

## 2015-11-23 NOTE — ED Notes (Signed)
Per pt, states cold symptoms for a week-chest and nasal congestion, cough

## 2015-11-23 NOTE — Discharge Instructions (Signed)
Follow up with your family doc.  Return for worsening sob, confusion.  Community-Acquired Pneumonia, Adult Pneumonia is an infection of the lungs. There are different types of pneumonia. One type can develop while a person is in a hospital. A different type, called community-acquired pneumonia, develops in people who are not, or have not recently been, in the hospital or other health care facility.  CAUSES Pneumonia may be caused by bacteria, viruses, or funguses. Community-acquired pneumonia is often caused by Streptococcus pneumonia bacteria. These bacteria are often passed from one person to another by breathing in droplets from the cough or sneeze of an infected person. RISK FACTORS The condition is more likely to develop in:  People who havechronic diseases, such as chronic obstructive pulmonary disease (COPD), asthma, congestive heart failure, cystic fibrosis, diabetes, or kidney disease.  People who haveearly-stage or late-stage HIV.  People who havesickle cell disease.  People who havehad their spleen removed (splenectomy).  People who havepoor Administrator.  People who havemedical conditions that increase the risk of breathing in (aspirating) secretions their own mouth and nose.   People who havea weakened immune system (immunocompromised).  People who smoke.  People whotravel to areas where pneumonia-causing germs commonly exist.  People whoare around animal habitats or animals that have pneumonia-causing germs, including birds, bats, rabbits, cats, and farm animals. SYMPTOMS Symptoms of this condition include:  Adry cough.  A wet (productive) cough.  Fever.  Sweating.  Chest pain, especially when breathing deeply or coughing.  Rapid breathing or difficulty breathing.  Shortness of breath.  Shaking chills.  Fatigue.  Muscle aches. DIAGNOSIS Your health care provider will take a medical history and perform a physical exam. You may also have  other tests, including:  Imaging studies of your chest, including X-rays.  Tests to check your blood oxygen level and other blood gases.  Other tests on blood, mucus (sputum), fluid around your lungs (pleural fluid), and urine. If your pneumonia is severe, other tests may be done to identify the specific cause of your illness. TREATMENT The type of treatment that you receive depends on many factors, such as the cause of your pneumonia, the medicines you take, and other medical conditions that you have. For most adults, treatment and recovery from pneumonia may occur at home. In some cases, treatment must happen in a hospital. Treatment may include:  Antibiotic medicines, if the pneumonia was caused by bacteria.  Antiviral medicines, if the pneumonia was caused by a virus.  Medicines that are given by mouth or through an IV tube.  Oxygen.  Respiratory therapy. Although rare, treating severe pneumonia may include:  Mechanical ventilation. This is done if you are not breathing well on your own and you cannot maintain a safe blood oxygen level.  Thoracentesis. This procedureremoves fluid around one lung or both lungs to help you breathe better. HOME CARE INSTRUCTIONS  Take over-the-counter and prescription medicines only as told by your health care provider.  Only takecough medicine if you are losing sleep. Understand that cough medicine can prevent your body's natural ability to remove mucus from your lungs.  If you were prescribed an antibiotic medicine, take it as told by your health care provider. Do not stop taking the antibiotic even if you start to feel better.  Sleep in a semi-upright position at night. Try sleeping in a reclining chair, or place a few pillows under your head.  Do not use tobacco products, including cigarettes, chewing tobacco, and e-cigarettes. If you need help quitting,  ask your health care provider.  Drink enough water to keep your urine clear or pale  yellow. This will help to thin out mucus secretions in your lungs. PREVENTION There are ways that you can decrease your risk of developing community-acquired pneumonia. Consider getting a pneumococcal vaccine if:  You are older than 52 years of age.  You are older than 52 years of age and are undergoing cancer treatment, have chronic lung disease, or have other medical conditions that affect your immune system. Ask your health care provider if this applies to you. There are different types and schedules of pneumococcal vaccines. Ask your health care provider which vaccination option is best for you. You may also prevent community-acquired pneumonia if you take these actions:  Get an influenza vaccine every year. Ask your health care provider which type of influenza vaccine is best for you.  Go to the dentist on a regular basis.  Wash your hands often. Use hand sanitizer if soap and water are not available. SEEK MEDICAL CARE IF:  You have a fever.  You are losing sleep because you cannot control your cough with cough medicine. SEEK IMMEDIATE MEDICAL CARE IF:  You have worsening shortness of breath.  You have increased chest pain.  Your sickness becomes worse, especially if you are an older adult or have a weakened immune system.  You cough up blood.   This information is not intended to replace advice given to you by your health care provider. Make sure you discuss any questions you have with your health care provider.   Document Released: 08/17/2005 Document Revised: 05/08/2015 Document Reviewed: 12/12/2014 Elsevier Interactive Patient Education [employer]2016 [employer]

## 2015-11-23 NOTE — ED Notes (Signed)
Awake. Verbally responsive. A/O x4. Resp even and unlabored. No audible adventitious breath sounds noted. ABC's intact.  

## 2015-11-23 NOTE — ED Notes (Signed)
Pt returned to room from xray.

## 2016-03-15 ENCOUNTER — Emergency Department (HOSPITAL_COMMUNITY)
Admission: EM | Admit: 2016-03-15 | Discharge: 2016-03-15 | Disposition: A | Discharge status: Home / Self Care | Payer: No Typology Code available for payment source | Attending: Emergency Medicine | Admitting: Emergency Medicine

## 2016-03-15 ENCOUNTER — Encounter (HOSPITAL_COMMUNITY): Payer: Self-pay | Admitting: Emergency Medicine

## 2016-03-15 DIAGNOSIS — Y990 Civilian activity done for income or pay: Secondary | ICD-10-CM | POA: Insufficient documentation

## 2016-03-15 DIAGNOSIS — T7840XA Allergy, unspecified, initial encounter: Secondary | ICD-10-CM

## 2016-03-15 DIAGNOSIS — Y9389 Activity, other specified: Secondary | ICD-10-CM | POA: Insufficient documentation

## 2016-03-15 DIAGNOSIS — Y929 Unspecified place or not applicable: Secondary | ICD-10-CM | POA: Insufficient documentation

## 2016-03-15 DIAGNOSIS — S43422A Sprain of left rotator cuff capsule, initial encounter: Secondary | ICD-10-CM | POA: Insufficient documentation

## 2016-03-15 DIAGNOSIS — Z792 Long term (current) use of antibiotics: Secondary | ICD-10-CM | POA: Insufficient documentation

## 2016-03-15 DIAGNOSIS — S46012A Strain of muscle(s) and tendon(s) of the rotator cuff of left shoulder, initial encounter: Secondary | ICD-10-CM

## 2016-03-15 DIAGNOSIS — M779 Enthesopathy, unspecified: Secondary | ICD-10-CM

## 2016-03-15 DIAGNOSIS — Z7982 Long term (current) use of aspirin: Secondary | ICD-10-CM | POA: Insufficient documentation

## 2016-03-15 DIAGNOSIS — F17210 Nicotine dependence, cigarettes, uncomplicated: Secondary | ICD-10-CM | POA: Insufficient documentation

## 2016-03-15 DIAGNOSIS — X500XXA Overexertion from strenuous movement or load, initial encounter: Secondary | ICD-10-CM | POA: Insufficient documentation

## 2016-03-15 MED ORDER — PREDNISONE 20 MG PO TABS: 20.0000 mg | ORAL_TABLET | Freq: Two times a day (BID) | ORAL | Status: DC

## 2016-03-15 MED ORDER — HYDROCODONE-ACETAMINOPHEN 5-325 MG PO TABS: 1.0000 | ORAL_TABLET | ORAL | Status: DC | PRN

## 2016-03-15 MED ORDER — PREDNISONE 20 MG PO TABS
60.0000 mg | ORAL_TABLET | Freq: Once | ORAL | Status: AC
  Administered 2016-03-15: 60 mg via ORAL
  Filled 2016-03-15: qty 3

## 2016-03-15 NOTE — ED Notes (Signed)
MD at bedside. 

## 2016-03-15 NOTE — ED Provider Notes (Signed)
CSN: [id number]     Arrival date & time 03/15/16  4098 History   First MD Initiated Contact with Patient 03/15/16 [phone]     Chief Complaint  Patient presents with  . Allergic Reaction  . Shoulder Injury      HPI  Impression presents for evaluation of allergic reaction shoulder pain. 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 This was about one week ago. Continued having pain using the arm. He is continuing to try to work. Her job requires a lot of lifting. She was given a medication by coworker yesterday. She thinks it may have been "ibuprofen or something". She's had allergic reaction to ibuprofen the past. She felt some swelling in her face hives on her arm. She used Benadryl and this is all seemed to improve. No difficult breathing or swelling in the throat.  Past Medical History  Diagnosis Date  . Obesity   . Panic attacks    Past Surgical History  Procedure Laterality Date  . Partial hysterectomy     Family History  Problem Relation Age of Onset  . Osteoarthritis Mother   . Hypertension Father   . Hypertension Sister    Social History  Substance Use Topics  . Smoking status: Current Some Day Smoker    Types: Cigarettes  . Smokeless tobacco: Never Used  . Alcohol Use: No   OB History    No data available     Review of Systems  Constitutional: Negative for fever, chills, diaphoresis, appetite change and fatigue.  HENT: Negative for mouth sores, sore throat and trouble swallowing.   Eyes: Negative for visual disturbance.  Respiratory: Negative for cough, chest tightness, shortness of breath and wheezing.   Cardiovascular: Negative for chest pain.  Gastrointestinal: Negative for nausea, vomiting, abdominal pain, diarrhea and abdominal distention.  Endocrine: Negative for polydipsia, polyphagia and polyuria.  Genitourinary: Negative for dysuria, frequency and hematuria.    Musculoskeletal: Positive for arthralgias. Negative for gait problem.  Skin: Negative for color change, pallor and rash.  Neurological: Negative for dizziness, syncope, light-headedness and headaches.  Hematological: Does not bruise/bleed easily.  Psychiatric/Behavioral: Negative for behavioral problems and confusion.      Allergies  Ibuprofen  Home Medications   Prior to Admission medications   Medication Sig Start Date End Date Taking? Authorizing Provider  aspirin 81 MG chewable tablet Chew 162-243 mg by mouth as needed for mild pain or headache.     Historical Provider, MD  azithromycin (ZITHROMAX) 250 MG tablet Take 1 tablet (250 mg total) by mouth daily. Take first 2 tablets together, then 1 every day until finished. 11/23/15   Melene Plan, DO  benzonatate (TESSALON) 100 MG capsule Take 1 capsule (100 mg total) by mouth every 8 (eight) hours. 07/22/15   Hannah Muthersbaugh, PA-C  cephALEXin (KEFLEX) 500 MG capsule Take 1 capsule (500 mg total) by mouth 4 (four) times daily. 11/23/15   Melene Plan, DO  fluticasone (FLONASE) 50 MCG/ACT nasal spray Place 2 sprays into both nostrils daily. 07/22/15   Hannah Muthersbaugh, PA-C  HYDROcodone-acetaminophen (NORCO/VICODIN) 5-325 MG tablet Take 1 tablet by mouth every 4 (four) hours as needed. 03/15/16   Rolland Porter, MD  Multiple Vitamin (MULTIVITAMIN WITH MINERALS) TABS tablet Take 1 tablet by mouth daily.    Historical Provider, MD  predniSONE (DELTASONE) 20 MG tablet Take 1 tablet (20 mg total) by mouth 2 (two)  times daily with a meal. 03/15/16   Rolland PorterMark Zayli Villafuerte, MD   BP 140/87 mmHg  Pulse 92  Temp(Src) 97.7 F (36.5 C) (Oral)  Resp 16  SpO2 99% Physical Exam  Constitutional: She is oriented to person, place, and time. She appears well-developed and well-nourished. No distress.  HENT:  Head: Normocephalic.  Pharynx normal. No swelling. No deviation. No stridor. Clear lungs. No apparent lip or tongue swelling. No obvious facial edema.  Eyes:  Conjunctivae are normal. Pupils are equal, round, and reactive to light. No scleral icterus.  Neck: Normal range of motion. Neck supple. No thyromegaly present.  Cardiovascular: Normal rate and regular rhythm.  Exam reveals no gallop and no friction rub.   No murmur heard. Pulmonary/Chest: Effort normal and breath sounds normal. No respiratory distress. She has no wheezes. She has no rales.  Abdominal: Soft. Bowel sounds are normal. She exhibits no distension. There is no tenderness. There is no rebound.  Musculoskeletal: Normal range of motion.       Arms: Neurological: She is alert and oriented to person, place, and time.  Skin: Skin is warm and dry. No rash noted.  No urticaria  Psychiatric: She has a normal mood and affect. Her behavior is normal.    ED Course  Procedures (including critical care time) Labs Review Labs Reviewed - No data to display  Imaging Review No results found. I have personally reviewed and evaluated these images and lab results as part of my medical decision-making.   EKG Interpretation None      MDM   Final diagnoses:  Allergic reaction, initial encounter  Tendonitis  Rotator cuff (capsule) sprain and strain, left, initial encounter    No signs of significant allergic reaction ongoing now. Clinically and by physical exam is rotator cuff tendinitis. Cannot rule out tear. Peak referral. Ice intermittent. No lifting over 5 pounds. Pain medication short course steroids for her shoulder as well as her allergic reaction.    Rolland PorterMark Kessler Kopinski, MD 03/15/16 [phone]

## 2016-03-15 NOTE — ED Notes (Addendum)
Pt states yesterday she pulled a muscle in her shoulder and took a pain medication that was given to her by a co-worker. States she's unsure of what it was, but thinks it may have been ibuprofen because she's allergic to it. States she got hot and her face began to swell, she got hot, and "I had red spots on my face." States she took some allergy medication and everything went away. Now here to be seen for left shoulder pain and an allergic reaction. Says she feels like her face still feels swollen and itchy. No obvious injury to her shoulder.

## 2016-03-15 NOTE — Discharge Instructions (Signed)
Rotator Cuff Injury °Rotator cuff injury is any type of injury to the set of muscles and tendons that make up the stabilizing unit of your shoulder. This unit holds the ball of your upper arm bone (humerus) in the socket of your shoulder blade (scapula).  °CAUSES °Injuries to your rotator cuff most commonly come from sports or activities that cause your arm to be moved repeatedly over your head. Examples of this include throwing, weight lifting, swimming, or racquet sports. Long lasting (chronic) irritation of your rotator cuff can cause soreness and swelling (inflammation), bursitis, and eventual damage to your tendons, such as a tear (rupture). °SIGNS AND SYMPTOMS °Acute rotator cuff tear: °· Sudden tearing sensation followed by severe pain shooting from your upper shoulder down your arm toward your elbow. °· Decreased range of motion of your shoulder because of pain and muscle spasm. °· Severe pain. °· Inability to raise your arm out to the side because of pain and loss of muscle power (large tears). °Chronic rotator cuff tear: °· Pain that usually is worse at night and may interfere with sleep. °· Gradual weakness and decreased shoulder motion as the pain worsens. °· Decreased range of motion. °Rotator cuff tendinitis:  °· Deep ache in your shoulder and the outside upper arm over your shoulder. °· Pain that comes on gradually and becomes worse when lifting your arm to the side or turning it inward. °DIAGNOSIS °Rotator cuff injury is diagnosed through a medical history, physical exam, and imaging exam. The medical history helps determine the type of rotator cuff injury. Your health care provider will look at your injured shoulder, feel the injured area, and ask you to move your shoulder in different positions. X-ray exams typically are done to rule out other causes of shoulder pain, such as fractures. MRI is the exam of choice for the most severe shoulder injuries because the images show muscles and tendons.    °TREATMENT  °Chronic tear: °· Medicine for pain, such as acetaminophen or ibuprofen. °· Physical therapy and range-of-motion exercises may be helpful in maintaining shoulder function and strength. °· Steroid injections into your shoulder joint. °· Surgical repair of the rotator cuff if the injury does not heal with noninvasive treatment. °Acute tear: °· Anti-inflammatory medicines such as ibuprofen and naproxen to help reduce pain and swelling. °· A sling to help support your arm and rest your rotator cuff muscles. Long-term use of a sling is not advised. It may cause significant stiffening of the shoulder joint. °· Surgery may be considered within a few weeks, especially in younger, active people, to return the shoulder to full function. °· Indications for surgical treatment include the following: °¨ Age younger than 60 years. °¨ Rotator cuff tears that are complete. °¨ Physical therapy, rest, and anti-inflammatory medicines have been used for 6-8 weeks, with no improvement. °¨ Employment or sporting activity that requires constant shoulder use. °Tendinitis: °· Anti-inflammatory medicines such as ibuprofen and naproxen to help reduce pain and swelling. °· A sling to help support your arm and rest your rotator cuff muscles. Long-term use of a sling is not advised. It may cause significant stiffening of the shoulder joint. °· Severe tendinitis may require: °¨ Steroid injections into your shoulder joint. °¨ Physical therapy. °¨ Surgery. °HOME CARE INSTRUCTIONS  °· Apply ice to your injury: °¨ Put ice in a plastic bag. °¨ Place a towel between your skin and the bag. °¨ Leave the ice on for 20 minutes, 2-3 times a day. °· If you   have a shoulder immobilizer (sling and straps), wear it until told otherwise by your health care provider. °· You may want to sleep on several pillows or in a recliner at night to lessen swelling and pain. °· Only take over-the-counter or prescription medicines for pain, discomfort, or fever as  directed by your health care provider. °· Do simple hand squeezing exercises with a soft rubber ball to decrease hand swelling. °SEEK MEDICAL CARE IF:  °· Your shoulder pain increases, or new pain or numbness develops in your arm, hand, or fingers. °· Your hand or fingers are colder than your other hand. °SEEK IMMEDIATE MEDICAL CARE IF:  °· Your arm, hand, or fingers are numb or tingling. °· Your arm, hand, or fingers are increasingly swollen and painful, or they turn white or blue. °MAKE SURE YOU: °· Understand these instructions. °· Will watch your condition. °· Will get help right away if you are not doing well or get worse. °  °This information is not intended to replace advice given to you by your health care provider. Make sure you discuss any questions you have with your health care provider. °  °Document Released: 08/14/2000 Document Revised: 08/22/2013 Document Reviewed: 03/29/2013 °Elsevier Interactive Patient Education ©2016 [employer] ° °

## 2016-03-25 ENCOUNTER — Other Ambulatory Visit: Payer: Self-pay | Admitting: Sports Medicine

## 2016-03-25 DIAGNOSIS — M7542 Impingement syndrome of left shoulder: Secondary | ICD-10-CM

## 2016-04-03 ENCOUNTER — Ambulatory Visit
Admission: RE | Admit: 2016-04-03 | Discharge: 2016-04-03 | Disposition: A | Discharge status: Home / Self Care | Payer: Self-pay | Source: Ambulatory Visit | Attending: Sports Medicine | Admitting: Sports Medicine

## 2016-04-03 DIAGNOSIS — M7542 Impingement syndrome of left shoulder: Secondary | ICD-10-CM

## 2017-06-23 ENCOUNTER — Emergency Department (HOSPITAL_COMMUNITY)
Admission: EM | Admit: 2017-06-23 | Discharge: 2017-06-23 | Disposition: A | Discharge status: Home / Self Care | Payer: Self-pay | Attending: Emergency Medicine | Admitting: Emergency Medicine

## 2017-06-23 ENCOUNTER — Encounter (HOSPITAL_COMMUNITY): Payer: Self-pay | Admitting: Emergency Medicine

## 2017-06-23 ENCOUNTER — Emergency Department (HOSPITAL_COMMUNITY): Payer: Self-pay

## 2017-06-23 DIAGNOSIS — R05 Cough: Secondary | ICD-10-CM | POA: Insufficient documentation

## 2017-06-23 DIAGNOSIS — Z79899 Other long term (current) drug therapy: Secondary | ICD-10-CM | POA: Insufficient documentation

## 2017-06-23 DIAGNOSIS — J069 Acute upper respiratory infection, unspecified: Secondary | ICD-10-CM | POA: Insufficient documentation

## 2017-06-23 DIAGNOSIS — F17210 Nicotine dependence, cigarettes, uncomplicated: Secondary | ICD-10-CM | POA: Insufficient documentation

## 2017-06-23 DIAGNOSIS — B9789 Other viral agents as the cause of diseases classified elsewhere: Secondary | ICD-10-CM

## 2017-06-23 DIAGNOSIS — R030 Elevated blood-pressure reading, without diagnosis of hypertension: Secondary | ICD-10-CM | POA: Insufficient documentation

## 2017-06-23 LAB — CBC
HCT: 35.9 % — ABNORMAL LOW (ref 36.0–46.0)
Hemoglobin: 11.9 g/dL — ABNORMAL LOW (ref 12.0–15.0)
MCH: 28.7 pg (ref 26.0–34.0)
MCHC: 33.1 g/dL (ref 30.0–36.0)
MCV: 86.5 fL (ref 78.0–100.0)
PLATELETS: 264 10*3/uL (ref 150–400)
RBC: 4.15 MIL/uL (ref 3.87–5.11)
RDW: 14.4 % (ref 11.5–15.5)
WBC: 6.7 10*3/uL (ref 4.0–10.5)

## 2017-06-23 LAB — I-STAT TROPONIN, ED: TROPONIN I, POC: 0.01 ng/mL (ref 0.00–0.08)

## 2017-06-23 LAB — BASIC METABOLIC PANEL
Anion gap: 7 (ref 5–15)
BUN: 11 mg/dL (ref 6–20)
CALCIUM: 9.2 mg/dL (ref 8.9–10.3)
CO2: 24 mmol/L (ref 22–32)
CREATININE: 0.65 mg/dL (ref 0.44–1.00)
Chloride: 108 mmol/L (ref 101–111)
Glucose, Bld: 110 mg/dL — ABNORMAL HIGH (ref 65–99)
Potassium: 4 mmol/L (ref 3.5–5.1)
SODIUM: 139 mmol/L (ref 135–145)

## 2017-06-23 NOTE — ED Triage Notes (Signed)
Pt complaint of ongoing cough for a week; pt verbalizes chest pain only with cough and deep breath at first but now is all the time.

## 2017-06-23 NOTE — ED Notes (Signed)
Patient transported to X-ray 

## 2017-06-23 NOTE — Discharge Instructions (Signed)
You appear to have an upper respiratory infection (URI). An upper respiratory tract infection, or cold, is a viral infection of the air passages leading to the lungs. It is contagious and can be spread to others, especially during the first 3 or 4 days. It cannot be cured by antibiotics or other medicines. °RETURN IMMEDIATELY IF you develop shortness of breath, confusion or altered mental status, a new rash, become dizzy, faint, or poorly responsive, or are unable to be cared for at home. ° °

## 2017-06-23 NOTE — ED Provider Notes (Signed)
[facility]-EMERGENCY DEPT Provider Note   CSN: [id number] Arrival date & time: 06/23/17  0800     History   Chief Complaint Chief Complaint  Patient presents with  . Cough  . Chest Pain    HPI Sara Arias is a 51 y.o. female who presents with 1 week of symptoms of URI including coryza, sore throat, painful cough.  She has pain which she describes as burning in her upper airways with breathing and deep coughing.  She has been using nighttime and daytime cold medications.  She continues to have cough.  The medications to ease her symptoms when she takes the medication.  She denies fevers, chills, nausea, vomiting.  She denies shortness of breath.  HPI  Past Medical History:  Diagnosis Date  . Obesity   . Panic attacks     There are no active problems to display for this patient.   Past Surgical History:  Procedure Laterality Date  . PARTIAL HYSTERECTOMY      OB History    No data available       Home Medications    Prior to Admission medications   Medication Sig Start Date End Date Taking? Authorizing Provider  Biotin w/ Vitamins C & E (HAIR SKIN & NAILS GUMMIES) 1250-7.5-7.5 MCG-MG-UNT CHEW Chew 2 each by mouth daily.   Yes [provider]  Cyanocobalamin (VITAMIN B-12 PO) Take 1 tablet by mouth daily.   Yes [provider]  Dextromethorphan HBr (DAY TIME COUGH NON-DROWSY) 15 MG/15ML LIQD Take 10 mLs by mouth every 6 (six) hours as needed (for cough).   Yes [provider]  sodium chloride (OCEAN) 0.65 % SOLN nasal spray Place 1 spray into both nostrils as needed for congestion.   Yes [provider]  fluticasone (FLONASE) 50 MCG/ACT nasal spray Place 2 sprays into both nostrils daily. Patient not taking: Reported on 06/23/2017 07/22/15   Muthersbaugh, Dahlia ClientHannah, PA-C    Family History Family History  Problem Relation Age of Onset  . Osteoarthritis Mother   . Hypertension Father   . Hypertension Sister      Social History Social History  Substance Use Topics  . Smoking status: Current Some Day Smoker    Types: Cigarettes  . Smokeless tobacco: Never Used  . Alcohol use No     Allergies   Ibuprofen   Review of Systems Review of Systems  Ten systems reviewed and are negative for acute change, except as noted in the HPI.   Physical Exam Updated Vital Signs BP (!) 141/89 (BP Location: Right Arm)   Pulse 83   Temp (!) 97.5 F (36.4 C) (Oral)   Resp 14   Ht 5\' 4"  (1.626 m)   Wt 90.7 kg (200 lb)   BMI 34.33 kg/m   Physical Exam  Physical Exam  Nursing note and vitals reviewed. Constitutional: She is oriented to person, place, and time. She appears well-developed and well-nourished. No distress.  HENT:  Head: Normocephalic and atraumatic.  Eyes: Conjunctivae normal and EOM are normal. Pupils are equal, round, and reactive to light. No scleral icterus.  Neck: Normal range of motion.  Cardiovascular: Normal rate, regular rhythm and normal heart sounds.  Exam reveals no gallop and no friction rub.   No murmur heard. Pulmonary/Chest: Effort normal and breath sounds normal. No respiratory distress.  Abdominal: Soft. Bowel sounds are normal. She exhibits no distension and no mass. There is no tenderness. There is no guarding.  Neurological: She is  alert and oriented to person, place, and time.  Skin: Skin is warm and dry. She is not diaphoretic.    ED Treatments / Results  Labs (all labs ordered are listed, but only abnormal results are displayed) Labs Reviewed  BASIC METABOLIC PANEL - Abnormal; Notable for the following:       Result Value   Glucose, Bld 110 (*)    All other components within normal limits  CBC - Abnormal; Notable for the following:    Hemoglobin 11.9 (*)    HCT 35.9 (*)    All other components within normal limits  I-STAT TROPONIN, ED    EKG  EKG Interpretation None       Radiology Dg Chest 2 View  Result Date: 06/23/2017 CLINICAL DATA:   Cough for 1 week, chest pain beginning this morning. EXAM: CHEST  2 VIEW COMPARISON:  PA and lateral chest 07/22/2015 and 11/23/2015. FINDINGS: The lungs are clear. Heart size is normal. No pneumothorax or pleural effusion. No bony abnormality. IMPRESSION: No acute disease. Electronically Signed   By: Drusilla Kanner M.D.   On: 06/23/2017 08:52    Procedures Procedures (including critical care time)  Medications Ordered in ED Medications - No data to display   Initial Impression / Assessment and Plan / ED Course  I have reviewed the triage vital signs and the nursing notes.  Pertinent labs & imaging results that were available during my care of the patient were reviewed by me and considered in my medical decision making (see chart for details).     Pt CXR negative for acute infiltrate. Patients symptoms are consistent with URI, likely viral etiology. Discussed that antibiotics are not indicated for viral infections. Pt will be discharged with symptomatic treatment.  Verbalizes understanding and is agreeable with plan. Pt is hemodynamically stable & in NAD prior to dc.   Final Clinical Impressions(s) / ED Diagnoses   Final diagnoses:  Viral URI with cough  Elevated blood pressure reading    New Prescriptions New Prescriptions   No medications on file     Arthor Captain, Cordelia Poche 06/23/17 1032    Azalia Bilis, MD 06/23/17 1702

## 2017-07-15 ENCOUNTER — Emergency Department (HOSPITAL_COMMUNITY)
Admission: EM | Admit: 2017-07-15 | Discharge: 2017-07-15 | Disposition: A | Discharge status: Home / Self Care | Payer: Medicaid Other | Attending: Emergency Medicine | Admitting: Emergency Medicine

## 2017-07-15 ENCOUNTER — Encounter (HOSPITAL_COMMUNITY): Payer: Self-pay

## 2017-07-15 ENCOUNTER — Other Ambulatory Visit: Payer: Self-pay

## 2017-07-15 DIAGNOSIS — F17210 Nicotine dependence, cigarettes, uncomplicated: Secondary | ICD-10-CM | POA: Insufficient documentation

## 2017-07-15 DIAGNOSIS — M25511 Pain in right shoulder: Secondary | ICD-10-CM | POA: Insufficient documentation

## 2017-07-15 DIAGNOSIS — Z79899 Other long term (current) drug therapy: Secondary | ICD-10-CM | POA: Insufficient documentation

## 2017-07-15 DIAGNOSIS — W19XXXA Unspecified fall, initial encounter: Secondary | ICD-10-CM

## 2017-07-15 DIAGNOSIS — M25561 Pain in right knee: Secondary | ICD-10-CM | POA: Insufficient documentation

## 2017-07-15 DIAGNOSIS — M25562 Pain in left knee: Secondary | ICD-10-CM | POA: Insufficient documentation

## 2017-07-15 DIAGNOSIS — M545 Low back pain: Secondary | ICD-10-CM | POA: Insufficient documentation

## 2017-07-15 DIAGNOSIS — M25512 Pain in left shoulder: Secondary | ICD-10-CM | POA: Insufficient documentation

## 2017-07-15 DIAGNOSIS — R52 Pain, unspecified: Secondary | ICD-10-CM
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
## 2017-07-15 NOTE — ED Notes (Signed)
Provider has tried 2x to see patient but patient on phone
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
## 2017-07-15 NOTE — Discharge Instructions (Signed)
I suspect your body aches are from fall. I do not think there are any bony fractures.   Take 1000 mg tylenol every 8 hours. A heating pad and light passage will help. Rest for 2 days, then start light range of motion and stretching exercises to help with body tightness and soreness.

## 2017-10-22 ENCOUNTER — Encounter (HOSPITAL_COMMUNITY): Payer: Self-pay | Admitting: Emergency Medicine

## 2017-10-22 ENCOUNTER — Emergency Department (HOSPITAL_COMMUNITY): Payer: Medicaid Other

## 2017-10-22 ENCOUNTER — Other Ambulatory Visit: Payer: Self-pay

## 2017-10-22 ENCOUNTER — Emergency Department (HOSPITAL_COMMUNITY)
Admission: EM | Admit: 2017-10-22 | Discharge: 2017-10-22 | Disposition: A | Discharge status: Home / Self Care | Payer: Medicaid Other | Attending: Emergency Medicine | Admitting: Emergency Medicine

## 2017-10-22 DIAGNOSIS — Z23 Encounter for immunization: Secondary | ICD-10-CM | POA: Insufficient documentation

## 2017-10-22 DIAGNOSIS — F17210 Nicotine dependence, cigarettes, uncomplicated: Secondary | ICD-10-CM | POA: Insufficient documentation

## 2017-10-22 DIAGNOSIS — Y92009 Unspecified place in unspecified non-institutional (private) residence as the place of occurrence of the external cause: Secondary | ICD-10-CM | POA: Insufficient documentation

## 2017-10-22 DIAGNOSIS — Z79899 Other long term (current) drug therapy: Secondary | ICD-10-CM | POA: Insufficient documentation

## 2017-10-22 DIAGNOSIS — W2209XA Striking against other stationary object, initial encounter: Secondary | ICD-10-CM | POA: Insufficient documentation

## 2017-10-22 DIAGNOSIS — Y9301 Activity, walking, marching and hiking: Secondary | ICD-10-CM | POA: Insufficient documentation

## 2017-10-22 DIAGNOSIS — S91332A Puncture wound without foreign body, left foot, initial encounter: Secondary | ICD-10-CM

## 2017-10-22 DIAGNOSIS — Y999 Unspecified external cause status: Secondary | ICD-10-CM | POA: Insufficient documentation

## 2017-10-22 MED ORDER — TETANUS-DIPHTH-ACELL PERTUSSIS 5-2.5-18.5 LF-MCG/0.5 IM SUSP
0.5000 mL | Freq: Once | INTRAMUSCULAR | Status: AC
Start: 2017-10-22 — End: 2017-10-22
  Administered 2017-10-22: 0.5 mL via INTRAMUSCULAR
  Filled 2017-10-22: qty 0.5

## 2017-10-22 NOTE — ED Notes (Signed)
Bed: WTR6 Expected date:  Expected time:  Means of arrival:  Comments: 

## 2017-10-22 NOTE — ED Triage Notes (Signed)
Pt believes she stepped on a nail/bobby pin on Tuesday; complaint of worsening pain since.

## 2017-10-22 NOTE — ED Provider Notes (Signed)
[facility]-EMERGENCY DEPT Provider Note   CSN: [id number] Arrival date & time: 10/22/17  [id number]     History   Chief Complaint Chief Complaint  Patient presents with  . Foot Injury    HPI Sara Arias is a 52 y.o. female.  HPI 52 year old female presents with left foot pain.  On 2/19 she stepped on what she thinks was either a small rusty nail or a rusty Bobby pin.  Had to pull it out of her foot.  She was not wearing shoes and was barefoot at the time.  This was in her house.  Since then she feels like her foot is a little swollen and painful to walk on.  She has not noticed any redness or drainage.  Her last tetanus shot was over 10 years ago.  She has been taking naproxen.  Past Medical History:  Diagnosis Date  . Obesity   . Panic attacks     There are no active problems to display for this patient.   Past Surgical History:  Procedure Laterality Date  . PARTIAL HYSTERECTOMY      OB History    No data available       Home Medications    Prior to Admission medications   Medication Sig Start Date End Date Taking? Authorizing Provider  Biotin w/ Vitamins C & E (HAIR SKIN & NAILS GUMMIES) 1250-7.5-7.5 MCG-MG-UNT CHEW Chew 2 each by mouth daily.    [provider]  Cyanocobalamin (VITAMIN B-12 PO) Take 1 tablet by mouth daily.    [provider]  Dextromethorphan HBr (DAY TIME COUGH NON-DROWSY) 15 MG/15ML LIQD Take 10 mLs by mouth every 6 (six) hours as needed (for cough).    [provider]  fluticasone (FLONASE) 50 MCG/ACT nasal spray Place 2 sprays into both nostrils daily. Patient not taking: Reported on 06/23/2017 07/22/15   Muthersbaugh, Dahlia ClientHannah, PA-C  sodium chloride (OCEAN) 0.65 % SOLN nasal spray Place 1 spray into both nostrils as needed for congestion.    [provider]    Family History Family History  Problem Relation Age of Onset  . Osteoarthritis Mother   . Hypertension Father   .  Hypertension Sister     Social History Social History   Tobacco Use  . Smoking status: Current Some Day Smoker    Types: Cigarettes  . Smokeless tobacco: Never Used  Substance Use Topics  . Alcohol use: No  . Drug use: No     Allergies   Ibuprofen   Review of Systems Review of Systems  Constitutional: Negative for fever.  Musculoskeletal: Positive for arthralgias.  Skin: Positive for wound. Negative for color change.  Neurological: Negative for weakness and numbness.  All other systems reviewed and are negative.    Physical Exam Updated Vital Signs BP (!) 149/96 (BP Location: Left Arm)   Pulse 97   Temp 97.9 F (36.6 C) (Oral)   Resp 18   SpO2 98%   Physical Exam  Constitutional: She is oriented to person, place, and time. She appears well-developed and well-nourished. No distress.  obese  HENT:  Head: Normocephalic and atraumatic.  Right Ear: External ear normal.  Left Ear: External ear normal.  Nose: Nose normal.  Eyes: Right eye exhibits no discharge. Left eye exhibits no discharge.  Cardiovascular: Normal rate and regular rhythm.  Pulses:      Dorsalis pedis pulses are 2+ on the left side.  Pulmonary/Chest: Effort normal.  Musculoskeletal:  Left foot: There is tenderness. There is no swelling.       Feet:  Neurological: She is alert and oriented to person, place, and time.  Skin: Skin is warm and dry. She is not diaphoretic.  Nursing note and vitals reviewed.    ED Treatments / Results  Labs (all labs ordered are listed, but only abnormal results are displayed) Labs Reviewed - No data to display  EKG  EKG Interpretation None       Radiology Dg Foot Complete Left  Result Date: 10/22/2017 CLINICAL DATA:  Stepped on rusty nail Tuesday. EXAM: LEFT FOOT - COMPLETE 3+ VIEW COMPARISON:  None. FINDINGS: Plantar and posterior calcaneal spurs. Mild diffuse soft tissue swelling. No acute bony abnormality. Specifically, no fracture,  subluxation, or dislocation. IMPRESSION: No acute bony abnormality. Electronically Signed   By: Charlett Nose M.D.   On: 10/22/2017 09:46    Procedures Procedures (including critical care time)  Medications Ordered in ED Medications  Tdap (BOOSTRIX) injection 0.5 mL (not administered)     Initial Impression / Assessment and Plan / ED Course  I have reviewed the triage vital signs and the nursing notes.  Pertinent labs & imaging results that were available during my care of the patient were reviewed by me and considered in my medical decision making (see chart for details).     Patient will be treated symptomatically with wound care and NSAIDs as tolerated.  Advised ice as well.  She is asking for a work note as she is not able to work today and will next go back 2/25.  Given this was not through a shoe my suspicion of Pseudomonas is low.  She does not appear to have an acute infection from this wound.  Also doubt tetanus as she has no other signs or symptoms and has been over 3 days.  Will update her tetanus.  Return precautions.  Final Clinical Impressions(s) / ED Diagnoses   Final diagnoses:  Puncture wound of left foot, initial encounter    ED Discharge Orders    None       Pricilla Loveless, MD 10/22/17 1144

## 2017-12-27 ENCOUNTER — Emergency Department (HOSPITAL_COMMUNITY): Payer: Medicaid Other

## 2017-12-27 ENCOUNTER — Encounter (HOSPITAL_COMMUNITY): Payer: Self-pay | Admitting: Emergency Medicine

## 2017-12-27 ENCOUNTER — Emergency Department (HOSPITAL_COMMUNITY)
Admission: EM | Admit: 2017-12-27 | Discharge: 2017-12-27 | Disposition: A | Discharge status: Home / Self Care | Payer: Medicaid Other | Attending: Emergency Medicine | Admitting: Emergency Medicine

## 2017-12-27 DIAGNOSIS — Y990 Civilian activity done for income or pay: Secondary | ICD-10-CM | POA: Insufficient documentation

## 2017-12-27 DIAGNOSIS — X500XXA Overexertion from strenuous movement or load, initial encounter: Secondary | ICD-10-CM | POA: Insufficient documentation

## 2017-12-27 DIAGNOSIS — F17210 Nicotine dependence, cigarettes, uncomplicated: Secondary | ICD-10-CM | POA: Insufficient documentation

## 2017-12-27 DIAGNOSIS — S46811A Strain of other muscles, fascia and tendons at shoulder and upper arm level, right arm, initial encounter: Secondary | ICD-10-CM | POA: Insufficient documentation

## 2017-12-27 DIAGNOSIS — Z79899 Other long term (current) drug therapy: Secondary | ICD-10-CM | POA: Insufficient documentation

## 2017-12-27 DIAGNOSIS — Y939 Activity, unspecified: Secondary | ICD-10-CM | POA: Insufficient documentation

## 2017-12-27 DIAGNOSIS — Y92199 Unspecified place in other specified residential institution as the place of occurrence of the external cause: Secondary | ICD-10-CM | POA: Insufficient documentation

## 2017-12-27 DIAGNOSIS — S29012A Strain of muscle and tendon of back wall of thorax, initial encounter: Secondary | ICD-10-CM

## 2017-12-27 DIAGNOSIS — S46919A Strain of unspecified muscle, fascia and tendon at shoulder and upper arm level, unspecified arm, initial encounter: Secondary | ICD-10-CM

## 2017-12-27 MED ORDER — ORPHENADRINE CITRATE ER 100 MG PO TB12: 100.0000 mg | ORAL_TABLET | Freq: Two times a day (BID) | ORAL | 0 refills | Status: DC

## 2017-12-27 MED ORDER — ACETAMINOPHEN 500 MG PO TABS: 1000.0000 mg | ORAL_TABLET | Freq: Four times a day (QID) | ORAL | 0 refills | Status: DC | PRN

## 2017-12-27 NOTE — Discharge Instructions (Signed)
1.  Call your orthopedic specialist and schedule a recheck this week. 2.  Rest elevate and ice areas of injury for the next 2 to 3 days. 3.  Take acetaminophen and muscle relaxer for the next 2 to 5 days for pain control.

## 2017-12-27 NOTE — ED Notes (Signed)
Bed: WA26 Expected date:  Expected time:  Means of arrival:  Comments: 

## 2017-12-27 NOTE — ED Provider Notes (Signed)
[facility]-EMERGENCY DEPT Provider Note   CSN: [id number] Arrival date & time: 12/27/17  1133     History   Chief Complaint Chief Complaint  Patient presents with  . Shoulder Pain  . Back Pain    HPI Sara Arias is a 52 y.o. female.  HPI Patient reports she has prior history of surgical rotator cuff repair on the left.  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  She reports yesterday as a had the left and she could feel a strain in her right shoulder, which is actually her better shoulder.  She reports today she is got discomfort across most of her upper back and notes that the left shoulder seems a little more swollen in appearance.  She was worried because of her prior surgeries as her might of been a reinjury.  No weakness numbness of the extremities. Past Medical History:  Diagnosis Date  . Obesity   . Panic attacks     There are no active problems to display for this patient.   Past Surgical History:  Procedure Laterality Date  . PARTIAL HYSTERECTOMY       OB History   None      Home Medications    Prior to Admission medications   Medication Sig Start Date End Date Taking? Authorizing Provider  acetaminophen (TYLENOL) 500 MG tablet Take 2 tablets (1,000 mg total) by mouth every 6 (six) hours as needed. 12/27/17   Arby Barrette, MD  Biotin w/ Vitamins C & E (HAIR SKIN & NAILS GUMMIES) 1250-7.5-7.5 MCG-MG-UNT CHEW Chew 2 each by mouth daily.    [provider]  Cyanocobalamin (VITAMIN B-12 PO) Take 1 tablet by mouth daily.    [provider]  Dextromethorphan HBr (DAY TIME COUGH NON-DROWSY) 15 MG/15ML LIQD Take 10 mLs by mouth every 6 (six) hours as needed (for cough).    [provider]  fluticasone (FLONASE) 50 MCG/ACT nasal spray Place 2 sprays into both nostrils daily. Patient not taking: Reported on 06/23/2017 07/22/15   Muthersbaugh, Dahlia Client, PA-C    orphenadrine (NORFLEX) 100 MG tablet Take 1 tablet (100 mg total) by mouth 2 (two) times daily. 12/27/17   Arby Barrette, MD  sodium chloride (OCEAN) 0.65 % SOLN nasal spray Place 1 spray into both nostrils as needed for congestion.    [provider]    Family History Family History  Problem Relation Age of Onset  . Osteoarthritis Mother   . Hypertension Father   . Hypertension Sister     Social History Social History   Tobacco Use  . Smoking status: Current Some Day Smoker    Types: Cigarettes  . Smokeless tobacco: Never Used  Substance Use Topics  . Alcohol use: No  . Drug use: No     Allergies   Ibuprofen   Review of Systems Review of Systems Constitutional: No fever no chills no malaise Respiratory: No cough no shortness of breath no chest pain  Physical Exam Updated Vital Signs BP (!) 170/99 (BP Location: Right Arm)   Pulse 98   Temp 98 F (36.7 C) (Oral)   Resp 18   SpO2 98%   Physical Exam  Constitutional: She is oriented to person, place, and time.  Patient is alert and nontoxic.  No acute distress.  Clinically well in appearance.  HENT:  Head: Normocephalic and atraumatic.  Eyes: EOM are normal.  Neck: Neck supple.  Pulmonary/Chest:  Effort normal.  Musculoskeletal:  Patient has well-healed trocar surgical scars over the left shoulder.  With examination, possible subtle enlargement of left shoulder as opposed to right.  No erythema.  No obvious effusions.  Patient has diffuse tenderness to palpation over the trapezius bilaterally.  Her motion is intact on the right shoulder with some discomfort.  Hand is neurovascularly intact.  Patient also has range of motion on the left but more limited at the shoulder.  She reports this may be slightly less than her baseline.  Neurovascularly intact.  Neurological: She is alert and oriented to person, place, and time. She exhibits normal muscle tone. Coordination normal.  Skin: Skin is warm and dry.   Psychiatric: She has a normal mood and affect.     ED Treatments / Results  Labs (all labs ordered are listed, but only abnormal results are displayed) Labs Reviewed - No data to display  EKG None  Radiology Dg Shoulder Right  Result Date: 12/27/2017 CLINICAL DATA:  Right shoulder pain after lifting a patient. EXAM: RIGHT SHOULDER - 2+ VIEW COMPARISON:  06/20/2013 FINDINGS: Inferior acromion spurring. Mild glenohumeral and acromioclavicular spurring. No evidence of fracture or malalignment. IMPRESSION: 1. No acute finding. 2. Inferior acromion spurring. 3. Mild degenerative spurring at the glenohumeral and acromioclavicular joints. Electronically Signed   By: Marnee Spring M.D.   On: 12/27/2017 12:51    Procedures Procedures (including critical care time)  Medications Ordered in ED Medications - No data to display   Initial Impression / Assessment and Plan / ED Course  I have reviewed the triage vital signs and the nursing notes.  Pertinent labs & imaging results that were available during my care of the patient were reviewed by me and considered in my medical decision making (see chart for details).      Final Clinical Impressions(s) / ED Diagnoses   Final diagnoses:  Strain of shoulder, unspecified laterality, initial encounter  Strain of thoracic back region  Findings are consistent with muscular strain from lifting.  Discomfort is fairly diffuse across the trapezius and upper back.  Patient also has some shoulder joint discomfort but no signs of dislocation nor likely acute rotator cuff injuries.  At this time patient is stable for conservative measures of rest ice and elevation with follow-up with orthopedics.  ED Discharge Orders        Ordered    acetaminophen (TYLENOL) 500 MG tablet  Every 6 hours PRN     12/27/17 1337    orphenadrine (NORFLEX) 100 MG tablet  2 times daily     12/27/17 1337       Arby Barrette, MD 12/27/17 1346

## 2017-12-27 NOTE — ED Triage Notes (Signed)
Patient here from home with complaints of right should pain and lower back pain. 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 Hx of left should surgery last year.

## 2018-02-01 ENCOUNTER — Encounter (HOSPITAL_COMMUNITY): Payer: Self-pay | Admitting: Emergency Medicine

## 2018-02-01 ENCOUNTER — Emergency Department (HOSPITAL_COMMUNITY)
Admission: EM | Admit: 2018-02-01 | Discharge: 2018-02-01 | Disposition: A | Discharge status: Home / Self Care | Payer: Medicaid Other | Attending: Emergency Medicine | Admitting: Emergency Medicine

## 2018-02-01 ENCOUNTER — Other Ambulatory Visit: Payer: Self-pay

## 2018-02-01 DIAGNOSIS — Y999 Unspecified external cause status: Secondary | ICD-10-CM | POA: Insufficient documentation

## 2018-02-01 DIAGNOSIS — W458XXA Other foreign body or object entering through skin, initial encounter: Secondary | ICD-10-CM | POA: Insufficient documentation

## 2018-02-01 DIAGNOSIS — Z79899 Other long term (current) drug therapy: Secondary | ICD-10-CM | POA: Insufficient documentation

## 2018-02-01 DIAGNOSIS — F17210 Nicotine dependence, cigarettes, uncomplicated: Secondary | ICD-10-CM | POA: Insufficient documentation

## 2018-02-01 DIAGNOSIS — H6690 Otitis media, unspecified, unspecified ear: Secondary | ICD-10-CM

## 2018-02-01 DIAGNOSIS — T162XXA Foreign body in left ear, initial encounter: Secondary | ICD-10-CM | POA: Insufficient documentation

## 2018-02-01 DIAGNOSIS — H6692 Otitis media, unspecified, left ear: Secondary | ICD-10-CM | POA: Insufficient documentation

## 2018-02-01 DIAGNOSIS — Y929 Unspecified place or not applicable: Secondary | ICD-10-CM | POA: Insufficient documentation

## 2018-02-01 DIAGNOSIS — T161XXA Foreign body in right ear, initial encounter: Secondary | ICD-10-CM | POA: Insufficient documentation

## 2018-02-01 DIAGNOSIS — Y939 Activity, unspecified: Secondary | ICD-10-CM | POA: Insufficient documentation

## 2018-02-01 MED ORDER — AMOXICILLIN 500 MG PO CAPS: 500.0000 mg | ORAL_CAPSULE | Freq: Two times a day (BID) | ORAL | 0 refills | Status: AC

## 2018-02-01 NOTE — Discharge Instructions (Addendum)
You were given a prescription for antibiotics. Please take the antibiotic prescription fully.   I have prescribed a new medication for you today. It is important that when you pick the prescription up you discuss the potential interactions of this medication with other medications you are taking, including over the counter medications, with the pharmacists.   This new medication has potential side effects. Be sure to contact your primary care provider or return to the emergency department if you are experiencing new symptoms that you are unable to tolerate after starting the medication. You need to receive medical evaluation immediately if you start to experience blistering of the skin, rash, swelling, or difficulty breathing as these signs could indicate a more serious medication side effect.   Please follow up with the ear, nose, and throat doctor in 3-5 days for re-evaluation of your symptoms.  Please return to the emergency department for any new or worsening symptoms.

## 2018-02-01 NOTE — ED Provider Notes (Addendum)
[facility]-EMERGENCY DEPT Provider Note   CSN: [id number] Arrival date & time: 02/01/18  1149     History   Chief Complaint Chief Complaint  Patient presents with  . Otalgia    HPI Sara Arias is a 52 y.o. female.  HPI   52 y/o female with a h/o panic attacks and obesity who presents to the ED today for evaluation of left ear pain which has been present for 1 month. States she has tried putting hydrogen peroxide, vix vapor rub, a friend's ear drops, and Q-tips in her ears to help relieve her pain however none of this has improved her symptoms.  Notes bleeding from the ear 2 days ago which is since resolved.  Reports some associated decreased hearing.  Has not taken any other over-the-counter medications for her symptoms.  No associated rhinorrhea, congestion, cough, eye pain, headaches, neck stiffness or other symptoms.  No fevers at home.  No recent swimming in a pool or at the beach.  Past Medical History:  Diagnosis Date  . Obesity   . Panic attacks     There are no active problems to display for this patient.   Past Surgical History:  Procedure Laterality Date  . PARTIAL HYSTERECTOMY       OB History   None      Home Medications    Prior to Admission medications   Medication Sig Start Date End Date Taking? Authorizing Provider  acetaminophen (TYLENOL) 500 MG tablet Take 2 tablets (1,000 mg total) by mouth every 6 (six) hours as needed. 12/27/17   Arby Barrette, MD  amoxicillin (AMOXIL) 500 MG capsule Take 1 capsule (500 mg total) by mouth 2 (two) times daily for 7 days. 02/01/18 02/08/18  Chasin Findling S, PA-C  Biotin w/ Vitamins C & E (HAIR SKIN & NAILS GUMMIES) 1250-7.5-7.5 MCG-MG-UNT CHEW Chew 2 each by mouth daily.    [provider]  Cyanocobalamin (VITAMIN B-12 PO) Take 1 tablet by mouth daily.    [provider]  Dextromethorphan HBr (DAY TIME COUGH NON-DROWSY) 15 MG/15ML LIQD Take 10 mLs by mouth every 6 (six)  hours as needed (for cough).    [provider]  fluticasone (FLONASE) 50 MCG/ACT nasal spray Place 2 sprays into both nostrils daily. Patient not taking: Reported on 06/23/2017 07/22/15   Muthersbaugh, Dahlia Client, PA-C  orphenadrine (NORFLEX) 100 MG tablet Take 1 tablet (100 mg total) by mouth 2 (two) times daily. 12/27/17   Arby Barrette, MD  sodium chloride (OCEAN) 0.65 % SOLN nasal spray Place 1 spray into both nostrils as needed for congestion.    [provider]    Family History Family History  Problem Relation Age of Onset  . Osteoarthritis Mother   . Hypertension Father   . Hypertension Sister     Social History Social History   Tobacco Use  . Smoking status: Current Some Day Smoker    Types: Cigarettes  . Smokeless tobacco: Never Used  Substance Use Topics  . Alcohol use: No  . Drug use: No     Allergies   Ibuprofen   Review of Systems Review of Systems  Constitutional: Negative for fever.  HENT: Positive for ear pain. Negative for congestion, rhinorrhea and sore throat.   Eyes: Negative for visual disturbance.  Respiratory: Negative for shortness of breath.   Cardiovascular: Negative for chest pain.  Gastrointestinal: Negative for abdominal pain, nausea and vomiting.  Genitourinary: Negative for frequency.  Musculoskeletal: Negative for back  pain.  Skin: Negative for wound.  Neurological: Negative for headaches.     Physical Exam Updated Vital Signs BP (!) 135/101 (BP Location: Right Arm)   Pulse 66   Temp 98.6 F (37 C) (Oral)   Resp 18   SpO2 98%   Physical Exam  Constitutional: She is oriented to person, place, and time. She appears well-developed and well-nourished. No distress.  HENT:  Head: Normocephalic and atraumatic.  Right TM was erythematous without effusion.  Left TM was not erythematous however had evidence of effusion. Canals appear clear bilaterally.  Oropharynx clear and moist no pharyngeal erythema or tonsillar  swelling.  Nose normal.  Eyes: Pupils are equal, round, and reactive to light. Conjunctivae and EOM are normal.  Neck: Normal range of motion. Neck supple.  Cardiovascular: Normal rate, regular rhythm and normal heart sounds.  Pulmonary/Chest: Effort normal and breath sounds normal.  Abdominal: Soft. There is no tenderness.  Musculoskeletal: Normal range of motion.  Neurological: She is alert and oriented to person, place, and time. No cranial nerve deficit.  Skin: Skin is warm and dry.  Psychiatric: She has a normal mood and affect.  Nursing note and vitals reviewed.   ED Treatments / Results  Labs (all labs ordered are listed, but only abnormal results are displayed) Labs Reviewed - No data to display  EKG None  Radiology No results found.  Procedures .Foreign Body Removal Date/Time: 02/01/2018 2:41 PM Performed by: Karrie Meres, PA-C Authorized by: Karrie Meres, PA-C  Consent: Verbal consent obtained. Consent given by: patient Patient understanding: patient states understanding of the procedure being performed Patient consent: the patient's understanding of the procedure matches consent given Procedure consent: procedure consent matches procedure scheduled Patient identity confirmed: verbally with patient Time out: Immediately prior to procedure a "time out" was called to verify the correct patient, procedure, equipment, support staff and site/side marked as required. Body area: ear (bilateral ears) Localization method: visualized Removal mechanism: alligator forceps Complexity: simple Number of foreign bodies recovered: 3 cotton tips from right eye, 2 cotton tips from left ear. Objects recovered: 5 Post-procedure assessment: foreign body removed Patient tolerance: Patient tolerated the procedure well with no immediate complications   (including critical care time)  Medications Ordered in ED Medications - No data to display   Initial Impression /  Assessment and Plan / ED Course  I have reviewed the triage vital signs and the nursing notes.  Pertinent labs & imaging results that were available during my care of the patient were reviewed by me and considered in my medical decision making (see chart for details).    Final Clinical Impressions(s) / ED Diagnoses   Final diagnoses:  Acute otitis media, unspecified otitis media type  Foreign body of both ears, initial encounter   Patient with left ear pain for the last month.  Vital signs stable and she is afebrile.  Foreign bodies removed from bilateral ears as noted above.  Right ear is erythematous without effusion.  Left ear has purulent effusion without erythema.  We will treat her for suspected otitis media and have her follow-up with [facility] given her recent history of bleeding from the left ear and decreased hearing.  No evidence of mastoiditis, no meningeal signs.  Advised her not to use hydrogen peroxide or any other products in her ear including Q-tips.  Advised her to follow-up with ENT this week for reevaluation and to return to the ER if she has any new or worsening symptoms in  the meantime.  All questions were answered and patient understands plan.  ED Discharge Orders        Ordered    amoxicillin (AMOXIL) 500 MG capsule  2 times daily     02/01/18 1445       Catrina Fellenz S, PA-C 02/01/18 1445    Karrie MeresCouture, Libra Gatz S, PA-C 02/01/18 1445    Tegeler, Canary Brimhristopher J, MD 02/02/18 2041

## 2018-02-01 NOTE — ED Notes (Signed)
Bed: WTR7 Expected date:  Expected time:  Means of arrival:  Comments: 

## 2018-02-01 NOTE — ED Triage Notes (Signed)
Left ear pain for a month. 

## 2018-10-31 ENCOUNTER — Emergency Department (HOSPITAL_COMMUNITY)
Admission: EM | Admit: 2018-10-31 | Discharge: 2018-10-31 | Disposition: A | Discharge status: Home / Self Care | Payer: Self-pay | Attending: Emergency Medicine | Admitting: Emergency Medicine

## 2018-10-31 ENCOUNTER — Emergency Department (HOSPITAL_COMMUNITY): Payer: Self-pay

## 2018-10-31 DIAGNOSIS — J111 Influenza due to unidentified influenza virus with other respiratory manifestations: Secondary | ICD-10-CM | POA: Insufficient documentation

## 2018-10-31 DIAGNOSIS — R69 Illness, unspecified: Secondary | ICD-10-CM

## 2018-10-31 DIAGNOSIS — F17210 Nicotine dependence, cigarettes, uncomplicated: Secondary | ICD-10-CM | POA: Insufficient documentation

## 2018-10-31 DIAGNOSIS — Z79899 Other long term (current) drug therapy: Secondary | ICD-10-CM | POA: Insufficient documentation

## 2018-10-31 LAB — CBC WITH DIFFERENTIAL/PLATELET
ABS IMMATURE GRANULOCYTES: 0.02 10*3/uL (ref 0.00–0.07)
BASOS ABS: 0 10*3/uL (ref 0.0–0.1)
Basophils Relative: 1 %
EOS ABS: 0.1 10*3/uL (ref 0.0–0.5)
Eosinophils Relative: 2 %
HEMATOCRIT: 40.6 % (ref 36.0–46.0)
HEMOGLOBIN: 12.7 g/dL (ref 12.0–15.0)
IMMATURE GRANULOCYTES: 0 %
LYMPHS ABS: 3.7 10*3/uL (ref 0.7–4.0)
LYMPHS PCT: 53 %
MCH: 28.2 pg (ref 26.0–34.0)
MCHC: 31.3 g/dL (ref 30.0–36.0)
MCV: 90 fL (ref 80.0–100.0)
MONOS PCT: 6 %
Monocytes Absolute: 0.4 10*3/uL (ref 0.1–1.0)
NEUTROS ABS: 2.6 10*3/uL (ref 1.7–7.7)
NEUTROS PCT: 38 %
NRBC: 0.3 % — AB (ref 0.0–0.2)
Platelets: 254 10*3/uL (ref 150–400)
RBC: 4.51 MIL/uL (ref 3.87–5.11)
RDW: 14.1 % (ref 11.5–15.5)
WBC: 6.8 10*3/uL (ref 4.0–10.5)

## 2018-10-31 LAB — BASIC METABOLIC PANEL
ANION GAP: 7 (ref 5–15)
BUN: 13 mg/dL (ref 6–20)
CO2: 28 mmol/L (ref 22–32)
Calcium: 9 mg/dL (ref 8.9–10.3)
Chloride: 105 mmol/L (ref 98–111)
Creatinine, Ser: 0.72 mg/dL (ref 0.44–1.00)
GFR calc Af Amer: >60 mL/min (ref >60)
GFR calc non Af Amer: >60 mL/min (ref >60)
GLUCOSE: 110 mg/dL — AB (ref 70–99)
Potassium: 4.2 mmol/L (ref 3.5–5.1)
SODIUM: 140 mmol/L (ref 135–145)

## 2018-10-31 MED ORDER — SODIUM CHLORIDE 0.9 % IV BOLUS
1000.0000 mL | Freq: Once | INTRAVENOUS | Status: AC
  Administered 2018-10-31: 1000 mL via INTRAVENOUS

## 2018-10-31 MED ORDER — ACETAMINOPHEN 325 MG PO TABS
650.0000 mg | ORAL_TABLET | Freq: Once | ORAL | Status: AC
  Administered 2018-10-31: 650 mg via ORAL
  Filled 2018-10-31: qty 2

## 2018-10-31 NOTE — Discharge Instructions (Signed)
If you develop new or worsening fever, vomiting, trouble breathing, or any other new/concerning symptoms then return to the ER for evaluation.

## 2018-10-31 NOTE — ED Triage Notes (Signed)
Onset of flu like symptoms since Saturday. + sore throat, cough, body aches, fatigue, chest pain and decrease in PO intake. +Recent exposure to illness at work.

## 2018-10-31 NOTE — ED Provider Notes (Signed)
[facility]-EMERGENCY DEPT Provider Note   CSN: [id number] Arrival date & time: 10/31/18  0714    History   Chief Complaint Chief Complaint  Patient presents with  . Sore Throat    HPI Sara Arias is a 53 y.o. female.     HPI  53 year old female presents with illness since 2/28.  She states she has been having cough, sore throat, chest pain with coughing, nasal congestion, and body aches.  She had some vomiting a couple days ago, none recently.  Has been having diarrhea this morning.  She feels weak and dizzy.  Has chest pain when coughing in her anterior chest.  Is also having sore throat.  Has tried Alka-Seltzer.  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 She's had a subjective fever.  Past Medical History:  Diagnosis Date  . Obesity   . Panic attacks     There are no active problems to display for this patient.   Past Surgical History:  Procedure Laterality Date  . PARTIAL HYSTERECTOMY       OB History   No obstetric history on file.      Home Medications    Prior to Admission medications   Medication Sig Start Date End Date Taking? Authorizing Provider  aspirin 325 MG tablet Take 650 mg by mouth daily as needed for mild pain or headache.   Yes [provider]  Biotin w/ Vitamins C & E (HAIR SKIN & NAILS GUMMIES) 1250-7.5-7.5 MCG-MG-UNT CHEW Chew 2 each by mouth daily.   Yes [provider]  Phenyleph-Doxylamine-DM-APAP (NYQUIL SEVERE COLD/FLU) 5-6.25-10-325 MG/15ML LIQD Take 15 mLs by mouth as needed (flu symptoms).   Yes [provider]  sodium-potassium bicarbonate (ALKA-SELTZER GOLD) TBEF dissolvable tablet Take 1 tablet by mouth daily as needed (congestion).   Yes [provider]  acetaminophen (TYLENOL) 500 MG tablet Take 2 tablets (1,000 mg total) by mouth every 6 (six) hours as needed. Patient not  taking: Reported on 10/31/2018 12/27/17   Arby Barrette, MD  fluticasone [facility]) 50 MCG/ACT nasal spray Place 2 sprays into both nostrils daily. Patient not taking: Reported on 06/23/2017 07/22/15   Muthersbaugh, Dahlia Client, PA-C  orphenadrine (NORFLEX) 100 MG tablet Take 1 tablet (100 mg total) by mouth 2 (two) times daily. Patient not taking: Reported on 10/31/2018 12/27/17   Arby Barrette, MD    Family History Family History  Problem Relation Age of Onset  . Osteoarthritis Mother   . Hypertension Father   . Hypertension Sister     Social History Social History   Tobacco Use  . Smoking status: Current Some Day Smoker    Types: Cigarettes  . Smokeless tobacco: Never Used  Substance Use Topics  . Alcohol use: No  . Drug use: No     Allergies   Ibuprofen   Review of Systems Review of Systems  Constitutional: Positive for diaphoresis (at night) and fever.  HENT: Positive for congestion and sore throat.   Respiratory: Positive for cough.   Cardiovascular: Positive for chest pain.  Gastrointestinal: Positive for diarrhea and vomiting. Negative for abdominal pain.  Neurological: Positive for dizziness.  All other systems reviewed and are negative.    Physical Exam Updated Vital Signs BP (!) 150/97   Pulse 73   Temp 97.8 F (36.6 C) (Oral)   Resp 10   Ht 5'  4" (1.626 m)   Wt 90.7 kg   SpO2 99%   BMI 34.33 kg/m   Physical Exam Vitals signs and nursing note reviewed.  Constitutional:      General: She is not in acute distress.    Appearance: She is well-developed. She is obese. She is not ill-appearing or diaphoretic.  HENT:     Head: Normocephalic and atraumatic.     Right Ear: External ear normal.     Left Ear: External ear normal.     Nose: Nose normal.     Mouth/Throat:     Comments: Clear voice, no obvious oropharyngeal swelling or abscess Eyes:     General:        Right eye: No discharge.        Left eye: No discharge.  Cardiovascular:     Rate and  Rhythm: Normal rate and regular rhythm.     Heart sounds: Normal heart sounds.  Pulmonary:     Effort: Pulmonary effort is normal.     Breath sounds: Normal breath sounds. No wheezing, rhonchi or rales.  Chest:     Chest wall: Tenderness (diffuse, mild) present.  Abdominal:     Palpations: Abdomen is soft.     Tenderness: There is no abdominal tenderness.  Skin:    General: Skin is warm and dry.  Neurological:     Mental Status: She is alert.  Psychiatric:        Mood and Affect: Mood is not anxious.      ED Treatments / Results  Labs (all labs ordered are listed, but only abnormal results are displayed) Labs Reviewed  BASIC METABOLIC PANEL - Abnormal; Notable for the following components:      Result Value   Glucose, Bld 110 (*)    All other components within normal limits  CBC WITH DIFFERENTIAL/PLATELET - Abnormal; Notable for the following components:   nRBC 0.3 (*)    All other components within normal limits    EKG EKG Interpretation  Date/Time:  Monday October 31 2018 08:55:50 EST Ventricular Rate:  74 PR Interval:    QRS Duration: 94 QT Interval:  410 QTC Calculation: 455 R Axis:   36 Text Interpretation:  Sinus rhythm Borderline T wave abnormalities Confirmed by Pricilla Loveless [phone]) on 10/31/2018 9:04:34 AM   Radiology Dg Chest 2 View  Result Date: 10/31/2018 CLINICAL DATA:  Cough, shortness of Breath EXAM: CHEST - 2 VIEW COMPARISON:  06/23/2017 FINDINGS: Heart and mediastinal contours are within normal limits. No focal opacities or effusions. No acute bony abnormality. IMPRESSION: No active cardiopulmonary disease. Electronically Signed   By: Charlett Nose M.D.   On: 10/31/2018 09:41    Procedures Procedures (including critical care time)  Medications Ordered in ED Medications  sodium chloride 0.9 % bolus 1,000 mL (1,000 mLs Intravenous New Bag/Given 10/31/18 0847)  acetaminophen (TYLENOL) tablet 650 mg (650 mg Oral Given 10/31/18 0847)     Initial  Impression / Assessment and Plan / ED Course  I have reviewed the triage vital signs and the nursing notes.  Pertinent labs & imaging results that were available during my care of the patient were reviewed by me and considered in my medical decision making (see chart for details).        Patient is overall well-appearing.  Given her dizziness/lightheadedness and diarrhea/vomiting, labs were obtained but are unremarkable.  She was given IV fluids.  There is no pneumonia.  I suspect this is influenza-like illness.  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  She is outside the Tamiflu treatment window.  She otherwise appears to have mild symptoms and appears stable for discharge home with supportive care such as Tylenol.  Final Clinical Impressions(s) / ED Diagnoses   Final diagnoses:  Influenza-like illness    ED Discharge Orders    None       Pricilla Loveless, MD 10/31/18 [phone]

## 2019-03-23 ENCOUNTER — Ambulatory Visit (HOSPITAL_COMMUNITY)
Admission: EM | Admit: 2019-03-23 | Discharge: 2019-03-23 | Disposition: A | Discharge status: Home / Self Care | Payer: Medicaid Other | Attending: Internal Medicine | Admitting: Internal Medicine

## 2019-03-23 ENCOUNTER — Encounter (HOSPITAL_COMMUNITY): Payer: Self-pay

## 2019-03-23 ENCOUNTER — Other Ambulatory Visit: Payer: Self-pay

## 2019-03-23 DIAGNOSIS — M25561 Pain in right knee: Secondary | ICD-10-CM

## 2019-03-23 MED ORDER — NAPROXEN 375 MG PO TABS: 375.0000 mg | ORAL_TABLET | Freq: Two times a day (BID) | ORAL | 0 refills | Status: DC

## 2019-03-23 NOTE — ED Triage Notes (Signed)
Pt states she has been walking and her right knee is swelling and she tried to wear a knee brace. Pt states it just keeps on swelling.

## 2019-03-23 NOTE — ED Provider Notes (Signed)
[facility]    CSN: [id number] Arrival date & time: 03/23/19  1212     History   Chief Complaint Chief Complaint  Patient presents with  . Knee Pain    HPI Sara Arias is a 53 y.Arias. female with no past medical history comes to the ED with right knee pain and swelling of a few days duration.Pain is throbbing and sharp.  Pain is aggravated with movement and is usually worse towards the end of the day.  No known relieving factors.  Patient has associated swelling in right knee.  No fever or chills.  No nausea or vomiting.   HPI  Past Medical History:  Diagnosis Date  . Obesity   . Panic attacks     There are no active problems to display for this patient.   Past Surgical History:  Procedure Laterality Date  . PARTIAL HYSTERECTOMY      OB History   No obstetric history on file.      Home Medications    Prior to Admission medications   Medication Sig Start Date End Date Taking? Authorizing Provider  aspirin 325 MG tablet Take 650 mg by mouth daily as needed for mild pain or headache.    [provider]  Biotin w/ Vitamins C & E (HAIR SKIN & NAILS GUMMIES) 1250-7.5-7.5 MCG-MG-UNT CHEW Chew 2 each by mouth daily.    [provider]  naproxen (NAPROSYN) 375 MG tablet Take 1 tablet (375 mg total) by mouth 2 (two) times daily. 03/23/19   Lamptey, Britta MccreedyPhilip O, MD  Phenyleph-Doxylamine-DM-APAP (NYQUIL SEVERE COLD/FLU) 5-6.25-10-325 MG/15ML LIQD Take 15 mLs by mouth as needed (flu symptoms).    [provider]  sodium-potassium bicarbonate (ALKA-SELTZER GOLD) TBEF dissolvable tablet Take 1 tablet by mouth daily as needed (congestion).    [provider]  fluticasone (FLONASE) 50 MCG/ACT nasal spray Place 2 sprays into both nostrils daily. Patient not taking: Reported on 06/23/2017 07/22/15 03/23/19  Muthersbaugh, Sara ClientHannah, PA-C    Family History Family History  Problem Relation Age of Onset  . Osteoarthritis Mother   . Hypertension  Father   . Hypertension Sister     Social History Social History   Tobacco Use  . Smoking status: Current Some Day Smoker    Types: Cigarettes  . Smokeless tobacco: Never Used  Substance Use Topics  . Alcohol use: No  . Drug use: No     Allergies   Ibuprofen   Review of Systems Review of Systems  Constitutional: Negative.   HENT: Negative.   Musculoskeletal: Positive for arthralgias and joint swelling. Negative for back pain, gait problem, myalgias, neck pain and neck stiffness.  Skin: Negative.  Negative for rash and wound.  Neurological: Negative.      Physical Exam Triage Vital Signs ED Triage Vitals [03/23/19 1246]  Enc Vitals Group     BP 123/81     Pulse Rate (!) 106     Resp 18     Temp 98.3 F (36.8 C)     Temp Source Oral     SpO2 100 %     Weight      Height      Head Circumference      Peak Flow      Pain Score      Pain Loc      Pain Edu?      Excl. in GC?    No data found.  Updated Vital Signs BP 127/82 (BP Location:  Right Arm)   Pulse 99   Temp 98.2 F (36.8 C) (Oral)   Resp 18   Wt 97.5 kg   SpO2 100%   BMI 36.90 kg/m   Visual Acuity Right Eye Distance:   Left Eye Distance:   Bilateral Distance:    Right Eye Near:   Left Eye Near:    Bilateral Near:     Physical Exam Constitutional:      Appearance: Normal appearance. She is not ill-appearing or toxic-appearing.  Cardiovascular:     Rate and Rhythm: Normal rate and regular rhythm.  Pulmonary:     Effort: Pulmonary effort is normal.     Breath sounds: Normal breath sounds.  Abdominal:     General: Bowel sounds are normal.     Palpations: Abdomen is soft.  Musculoskeletal: Normal range of motion.        General: Tenderness present. No swelling, deformity or signs of injury.  Skin:    General: Skin is warm.     Capillary Refill: Capillary refill takes less than 2 seconds.     Coloration: Skin is not jaundiced.     Findings: No bruising or lesion.  Neurological:      General: No focal deficit present.     Mental Status: She is alert and oriented to person, place, and time.      UC Treatments / Results  Labs (all labs ordered are listed, but only abnormal results are displayed) Labs Reviewed - No data to display  EKG   Radiology No results found.  Procedures Procedures (including critical care time)  Medications Ordered in UC Medications - No data to display  Initial Impression / Assessment and Plan / UC Course  I have reviewed the triage vital signs and the nursing notes.  Pertinent labs & imaging results that were available during my care of the patient were reviewed by me and considered in my medical decision making (see chart for details).     1.  Right knee pain likely secondary to osteoarthritis: Patient tolerates naproxen but allergic to ibuprofen. Patient is willing to take naproxen.  Patient is educated about range of motion exercises, icing the knee and rest after days work.  If patient continues to have right knee swelling with activity, she may need MRI to evaluate meniscal injury. Final Clinical Impressions(s) / UC Diagnoses   Final diagnoses:  Acute pain of right knee   Discharge Instructions   None    ED Prescriptions    Medication Sig Dispense Auth. Provider   naproxen (NAPROSYN) 375 MG tablet Take 1 tablet (375 mg total) by mouth 2 (two) times daily. 20 tablet Lamptey, Myrene Galas, MD     Controlled Substance Prescriptions [place] Controlled Substance Registry consulted? No   Chase Picket, MD 03/23/19 1410

## 2019-06-02 IMAGING — CR DG CHEST 2V
2 series · 2 of 2 positions shown · non-contrast
Comparison: 06/23/2017

CLINICAL DATA: Cough, shortness of Breath

EXAM:
CHEST - 2 VIEW

[w chest pa]
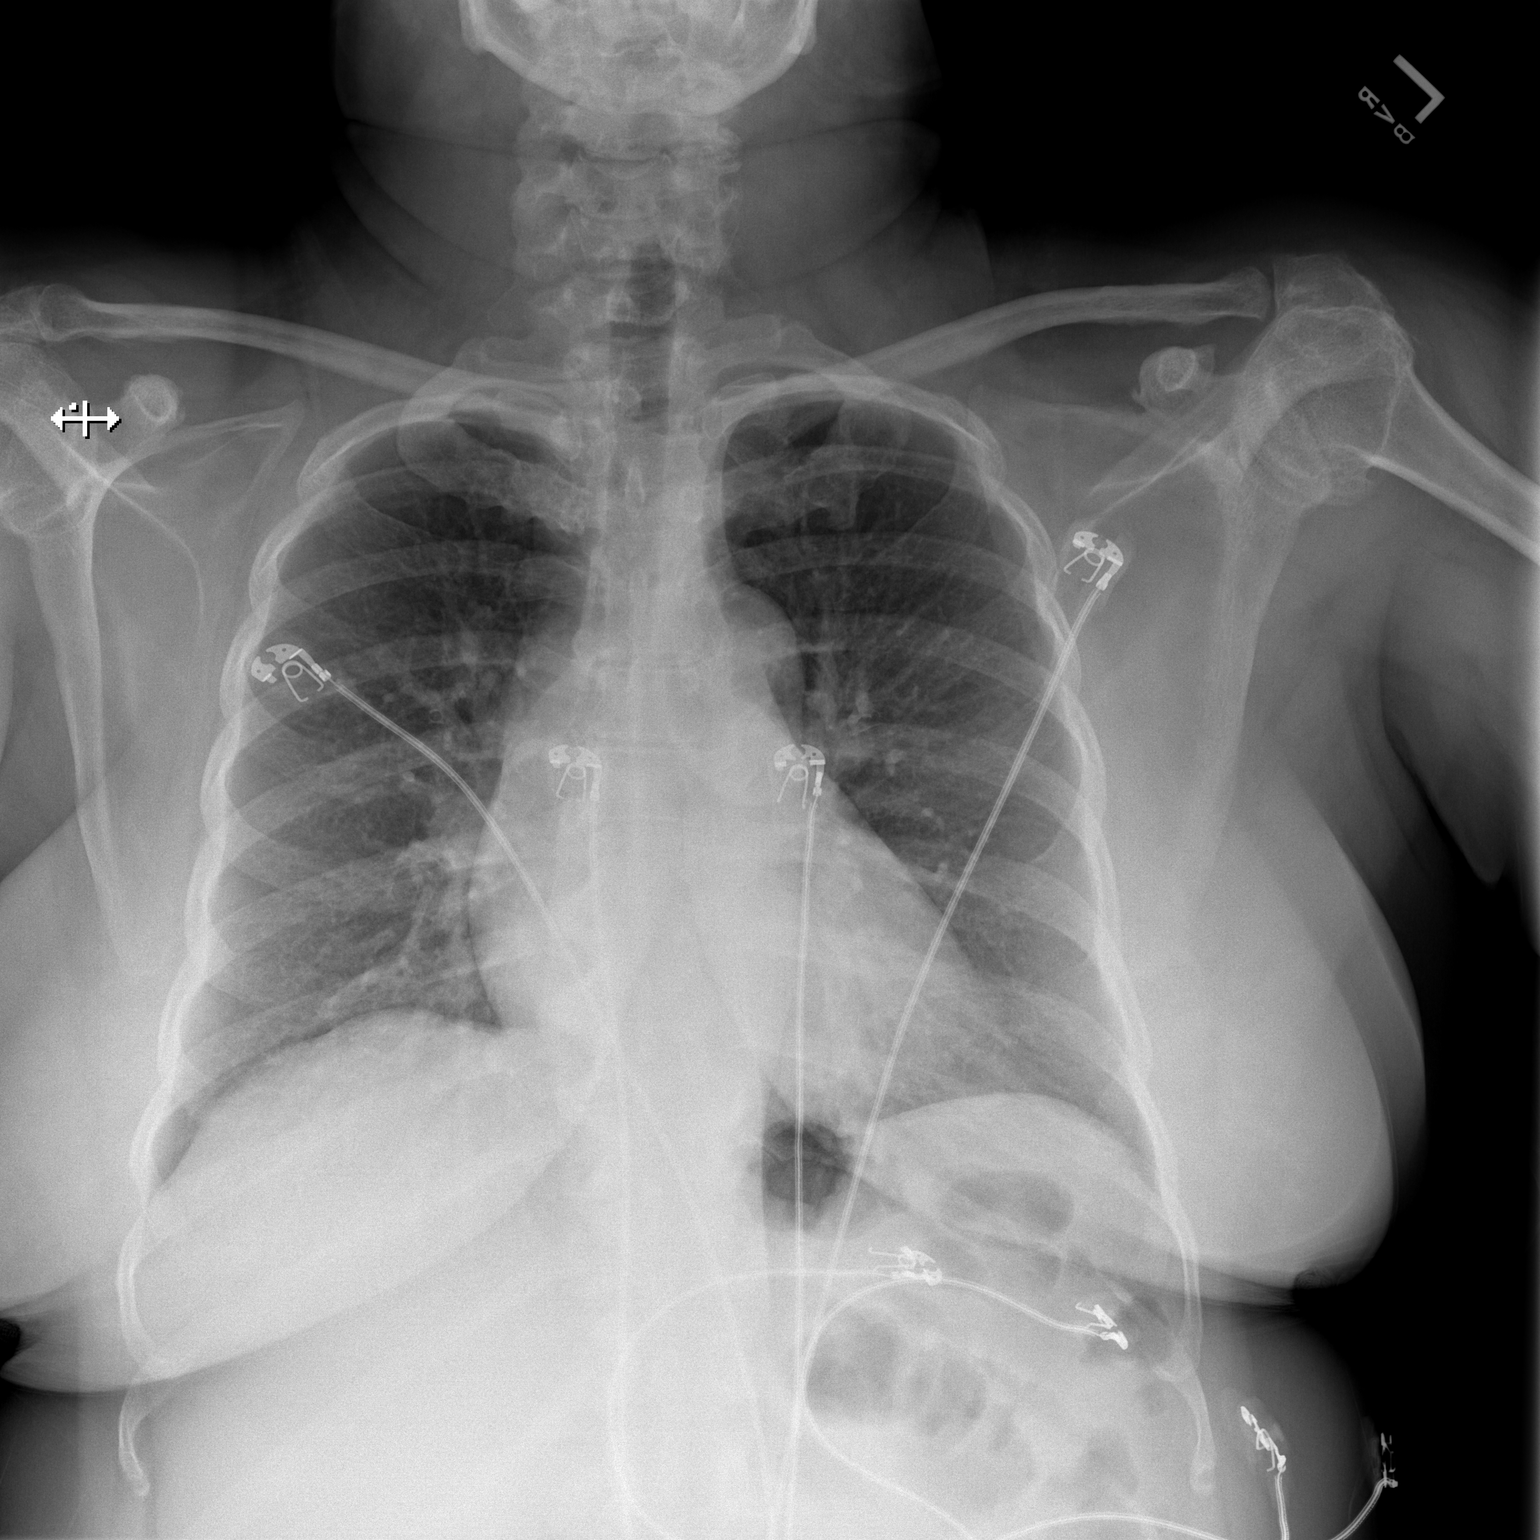

[w chest lat]
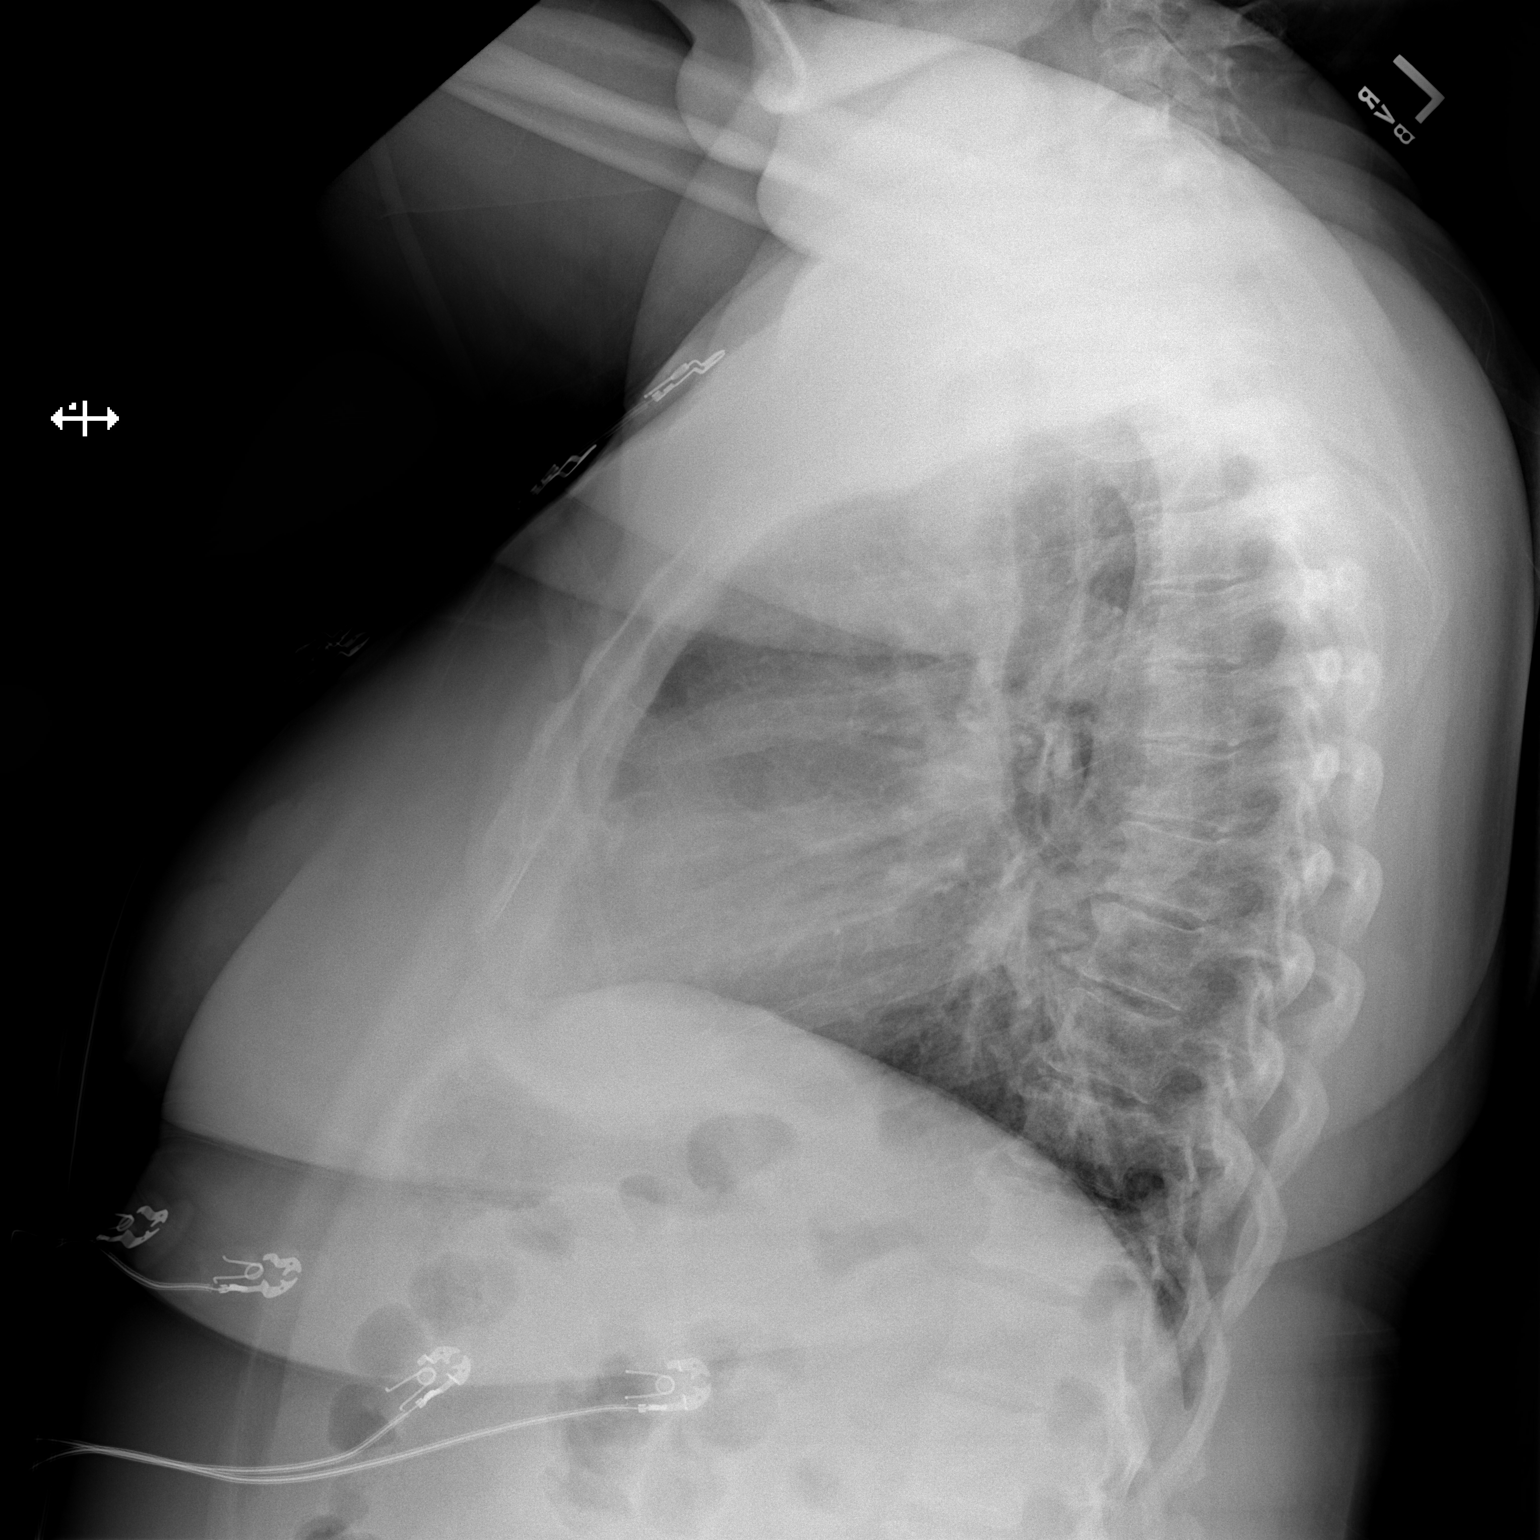

[2 of 2 positions shown; findings below may reference images not displayed]

FINDINGS: Heart and mediastinal contours are within normal limits. No focal
opacities or effusions. No acute bony abnormality.
IMPRESSION: No active cardiopulmonary disease.

## 2019-11-04 ENCOUNTER — Ambulatory Visit: Payer: Medicaid Other | Attending: Internal Medicine

## 2019-11-04 DIAGNOSIS — Z23 Encounter for immunization: Secondary | ICD-10-CM | POA: Insufficient documentation

## 2019-11-04 NOTE — Progress Notes (Signed)
   Covid-19 Vaccination Clinic  Name:  Sara Arias    MRN: [id number] DOB: 1966/06/22  11/04/2019  Sara Arias was observed post Covid-19 immunization for 15 minutes without incident. She was provided with Vaccine Information Sheet and instruction to access the V-Safe system.   Sara Arias was instructed to call 911 with any severe reactions post vaccine: Marland Kitchen Difficulty breathing  . Swelling of face and throat  . A fast heartbeat  . A bad rash all over body  . Dizziness and weakness   Immunizations Administered    Name Date Dose VIS Date Route   Pfizer COVID-19 Vaccine 11/04/2019  9:03 AM 0.3 mL 08/11/2019 Intramuscular   Manufacturer: [employer], [employer]   Lot: [id number]   NDC: [address]-1

## 2019-11-25 ENCOUNTER — Ambulatory Visit: Payer: Medicaid Other | Attending: Internal Medicine

## 2019-11-25 DIAGNOSIS — Z23 Encounter for immunization: Secondary | ICD-10-CM

## 2019-11-25 NOTE — Progress Notes (Signed)
   Covid-19 Vaccination Clinic  Name:  Sara Arias    MRN: [id number] DOB: 09/07/1965  11/25/2019  Sara Arias was observed post Covid-19 immunization for 15 minutes without incident. She was provided with Vaccine Information Sheet and instruction to access the V-Safe system.   Sara Arias was instructed to call 911 with any severe reactions post vaccine: Marland Kitchen Difficulty breathing  . Swelling of face and throat  . A fast heartbeat  . A bad rash all over body  . Dizziness and weakness   Immunizations Administered    Name Date Dose VIS Date Route   Pfizer COVID-19 Vaccine 11/25/2019  9:20 AM 0.3 mL 08/11/2019 Intramuscular   Manufacturer: [employer], [employer]   Lot: [id number]   NDC: [address]-1

## 2020-01-26 ENCOUNTER — Ambulatory Visit (HOSPITAL_COMMUNITY)
Admission: EM | Admit: 2020-01-26 | Discharge: 2020-01-26 | Disposition: A | Discharge status: Home / Self Care | Payer: Self-pay | Attending: Family Medicine | Admitting: Family Medicine

## 2020-01-26 ENCOUNTER — Other Ambulatory Visit: Payer: Self-pay

## 2020-01-26 ENCOUNTER — Encounter (HOSPITAL_COMMUNITY): Payer: Self-pay

## 2020-01-26 DIAGNOSIS — M25561 Pain in right knee: Secondary | ICD-10-CM

## 2020-01-26 DIAGNOSIS — M25461 Effusion, right knee: Secondary | ICD-10-CM

## 2020-01-26 DIAGNOSIS — M1711 Unilateral primary osteoarthritis, right knee: Secondary | ICD-10-CM

## 2020-01-26 MED ORDER — MELOXICAM 15 MG PO TABS: 15.0000 mg | ORAL_TABLET | Freq: Every day | ORAL | 0 refills | Status: DC

## 2020-01-26 MED ORDER — METHYLPREDNISOLONE ACETATE 80 MG/ML IJ SUSP
INTRAMUSCULAR | Status: AC
  Filled 2020-01-26: qty 1

## 2020-01-26 NOTE — Discharge Instructions (Addendum)
I have prescribed mobic .  Take one a day.  This is an arthritis type pain reliever, anti-inflammatory After an injection you should stay off your feet today.  Put ice on it for 20 minutes every couple of hours Call or return if you have increased pain or problems

## 2020-01-26 NOTE — ED Provider Notes (Signed)
[facility]    CSN: [id number] Arrival date & time: 01/26/20  0856      History   Chief Complaint Chief Complaint  Patient presents with  . Knee Pain    HPI Sara Arias is a 54 y.o. female.   HPI  Chronic right knee pain.  Known osteoarthritis.   first identified on x-ray in 2012.  Less pain in the left knee.  The right knee has swelling, pain, heat, and limited movement.  Appears to be getting worse over time.  No fall, injury, trauma. She is taken arthritis medicine in the past.  This gives her some temporary relief. She has tried wearing a brace, this seemed to irritate the knee more She states recently she has been riding an exercise bike.  She thinks this is helped somewhat. She has not seen an orthopedic specialist.  We discussed this is indicated of her knee pain continues Patient is requesting a steroid injection into her knee.  She had this performed in her left shoulder when it was painful.  She states this gave her many months of pain relief.  She would like to have a shot in her knee so that it improves, allowing her to further her exercise program, physical fitness, try to lose some weight.  Past Medical History:  Diagnosis Date  . Obesity   . Panic attacks     There are no problems to display for this patient.   Past Surgical History:  Procedure Laterality Date  . PARTIAL HYSTERECTOMY      OB History   No obstetric history on file.      Home Medications    Prior to Admission medications   Medication Sig Start Date End Date Taking? Authorizing Provider  aspirin 325 MG tablet Take 650 mg by mouth daily as needed for mild pain or headache.    [provider]  Biotin w/ Vitamins C & E (HAIR SKIN & NAILS GUMMIES) 1250-7.5-7.5 MCG-MG-UNT CHEW Chew 2 each by mouth daily.    [provider]  meloxicam (MOBIC) 15 MG tablet Take 1 tablet (15 mg total) by mouth daily. 01/26/20   Raylene Everts, MD  Phenyleph-Doxylamine-DM-APAP  (NYQUIL SEVERE COLD/FLU) 5-6.25-10-325 MG/15ML LIQD Take 15 mLs by mouth as needed (flu symptoms).    [provider]  sodium-potassium bicarbonate (ALKA-SELTZER GOLD) TBEF dissolvable tablet Take 1 tablet by mouth daily as needed (congestion).    [provider]  fluticasone (FLONASE) 50 MCG/ACT nasal spray Place 2 sprays into both nostrils daily. Patient not taking: Reported on 06/23/2017 07/22/15 03/23/19  Muthersbaugh, Jarrett Soho, PA-C    Family History Family History  Problem Relation Age of Onset  . Osteoarthritis Mother   . Hypertension Father   . Hypertension Sister     Social History Social History   Tobacco Use  . Smoking status: Current Some Day Smoker    Types: Cigarettes  . Smokeless tobacco: Never Used  Substance Use Topics  . Alcohol use: No  . Drug use: No     Allergies   Ibuprofen   Review of Systems Review of Systems  Musculoskeletal: Positive for arthralgias and gait problem.   Physical Exam Triage Vital Signs ED Triage Vitals  Enc Vitals Group     BP 01/26/20 0919 (!) 149/94     Pulse Rate 01/26/20 0919 85     Resp 01/26/20 0919 18     Temp 01/26/20 0919 98 F (36.7 C)     Temp  Source 01/26/20 0919 Oral     SpO2 01/26/20 0919 100 %     Weight --      Height --      Head Circumference --      Peak Flow --      Pain Score 01/26/20 0916 10     Pain Loc --      Pain Edu? --      Excl. in GC? --    No data found.  Updated Vital Signs BP (!) 149/94 (BP Location: Left Arm)   Pulse 85   Temp 98 F (36.7 C) (Oral)   Resp 18   SpO2 100%    Physical Exam Constitutional:      General: She is not in acute distress.    Appearance: She is well-developed. She is obese.  HENT:     Head: Normocephalic and atraumatic.     Mouth/Throat:     Comments: Mask is in place Eyes:     Conjunctiva/sclera: Conjunctivae normal.     Pupils: Pupils are equal, round, and reactive to light.  Cardiovascular:     Rate and Rhythm: Normal rate.    Pulmonary:     Effort: Pulmonary effort is normal. No respiratory distress.  Abdominal:     General: There is no distension.     Palpations: Abdomen is soft.  Musculoskeletal:        General: Normal range of motion.     Cervical back: Normal range of motion.     Comments: Both knees are examined.  Left knee has mild crepitus, full range of motion, no instability.  Right knee has warmth.  Trace effusion.  Medial joint line tenderness.  Crepitus with range of motion.  No instability  Skin:    General: Skin is warm and dry.  Neurological:     Mental Status: She is alert.     Gait: Gait abnormal.  Psychiatric:        Mood and Affect: Mood normal.        Behavior: Behavior normal.      UC Treatments / Results  Labs (all labs ordered are listed, but only abnormal results are displayed) Labs Reviewed - No data to display  EKG   Radiology No results found.  Procedures Join Aspiration/Injection  Date/Time: 01/26/2020 11:52 AM Performed by: Eustace Moore, MD Authorized by: Eustace Moore, MD   Consent:    Consent obtained:  Verbal   Consent given by:  Patient   Risks discussed:  Infection and pain   Alternatives discussed:  No treatment and alternative treatment Location:    Location:  Knee   Knee:  R knee Anesthesia (see MAR for exact dosages):    Anesthesia method:  Local infiltration   Local anesthetic:  Lidocaine 2% w/o epi Procedure details:    Ultrasound guidance: no     Approach:  Lateral   Aspirate amount:  0   Steroid injected: yes     Specimen collected: no   Post-procedure details:    Dressing:  Adhesive bandage Comments:     Injection performed with 25-gauge needle.  Patient tolerated well.  80 mg Depo-Medrol with 3 cc of lidocaine was injected into the knee joint without difficulty.   (including critical care time)  Medications Ordered in UC Medications - No data to display  Initial Impression / Assessment and Plan / UC Course  I have  reviewed the triage vital signs and the nursing notes.  Pertinent labs &  imaging results that were available during my care of the patient were reviewed by me and considered in my medical decision making (see chart for details).     We talked about postinjection care.  Activity limitations.  Follow-up with your primary care doctor, referral to orthopedic if pain persists or worsens Final Clinical Impressions(s) / UC Diagnoses   Final diagnoses:  Primary osteoarthritis of right knee  Pain and swelling of right knee     Discharge Instructions     I have prescribed mobic .  Take one a day.  This is an arthritis type pain reliever, anti-inflammatory After an injection you should stay off your feet today.  Put ice on it for 20 minutes every couple of hours Call or return if you have increased pain or problems   ED Prescriptions    Medication Sig Dispense Auth. Provider   meloxicam (MOBIC) 15 MG tablet Take 1 tablet (15 mg total) by mouth daily. 30 tablet Eustace Moore, MD     PDMP not reviewed this encounter.   Eustace Moore, MD 01/26/20 1153

## 2020-01-26 NOTE — ED Triage Notes (Signed)
Pt presents to UC with intermittent right knee pain x 2 years approx. Pt states right knee is swollen for 1 month. Pt using icy hot without relief.

## 2020-07-27 ENCOUNTER — Encounter (HOSPITAL_COMMUNITY): Payer: Self-pay

## 2020-07-27 ENCOUNTER — Ambulatory Visit (HOSPITAL_COMMUNITY)
Admission: EM | Admit: 2020-07-27 | Discharge: 2020-07-27 | Disposition: A | Discharge status: Home / Self Care | Payer: Medicaid Other | Attending: Family Medicine | Admitting: Family Medicine

## 2020-07-27 ENCOUNTER — Other Ambulatory Visit: Payer: Self-pay

## 2020-07-27 DIAGNOSIS — T148XXA Other injury of unspecified body region, initial encounter: Secondary | ICD-10-CM

## 2020-07-27 DIAGNOSIS — M25561 Pain in right knee: Secondary | ICD-10-CM

## 2020-07-27 DIAGNOSIS — M7918 Myalgia, other site: Secondary | ICD-10-CM

## 2020-07-27 DIAGNOSIS — W19XXXA Unspecified fall, initial encounter: Secondary | ICD-10-CM

## 2020-07-27 MED ORDER — CYCLOBENZAPRINE HCL 5 MG PO TABS: 5.0000 mg | ORAL_TABLET | Freq: Three times a day (TID) | ORAL | 0 refills | Status: DC | PRN

## 2020-07-27 NOTE — Discharge Instructions (Signed)
Take Tylenol as needed for pain  I have sent in cyclobenzaprine for you to take up to 3 times a day as needed for muscle spasms.  This medication can make you sleepy.  Do not drive a car or operate machinery while taking this medication.  If symptoms are persisting I would have you follow-up with sports medicine as listed below.  If there are acute worsening symptoms, follow-up in the ER

## 2020-07-27 NOTE — ED Triage Notes (Signed)
Pt present a fall on Thursday, pt C/o right arm/shoulder, leg and knee pain. Pt right knee and leg is swollen.

## 2020-07-27 NOTE — ED Provider Notes (Signed)
[facility]   [id number] 07/27/20 Arrival Time: 1043  WL:[id number] PAIN  SUBJECTIVE: History from: patient. Sara Arias is a 54 y.o. female complains of right shoulder, right elbow, right knee and leg pain since she had a mechanical fall 2 days ago.  Has taken Tylenol with little relief.  Cannot take ibuprofen as she is allergic.  Pain is aggravated by activity.  Denies hearing any popping or cracking noises when she fell.  Reports that she is just very sore.  Denies fever, chills, erythema, ecchymosis, effusion, weakness, numbness and tingling, saddle paresthesias, loss of bowel or bladder function.      ROS: As per HPI.  All other pertinent ROS negative.     Past Medical History:  Diagnosis Date  . Obesity   . Panic attacks    Past Surgical History:  Procedure Laterality Date  . PARTIAL HYSTERECTOMY     Allergies  Allergen Reactions  . Ibuprofen Swelling    Swelling of the face, per patient naproxyn is ok   No current facility-administered medications on file prior to encounter.   Current Outpatient Medications on File Prior to Encounter  Medication Sig Dispense Refill  . aspirin 325 MG tablet Take 650 mg by mouth daily as needed for mild pain or headache.    . Biotin w/ Vitamins C & E (HAIR SKIN & NAILS GUMMIES) 1250-7.5-7.5 MCG-MG-UNT CHEW Chew 2 each by mouth daily.    . meloxicam (MOBIC) 15 MG tablet Take 1 tablet (15 mg total) by mouth daily. 30 tablet 0  . Phenyleph-Doxylamine-DM-APAP (NYQUIL SEVERE COLD/FLU) 5-6.25-10-325 MG/15ML LIQD Take 15 mLs by mouth as needed (flu symptoms).    . sodium-potassium bicarbonate (ALKA-SELTZER GOLD) TBEF dissolvable tablet Take 1 tablet by mouth daily as needed (congestion).    . [DISCONTINUED] fluticasone (FLONASE) 50 MCG/ACT nasal spray Place 2 sprays into both nostrils daily. (Patient not taking: Reported on 06/23/2017) 9.9 g 2   Social History   Socioeconomic History  . Marital status: Divorced    Spouse name: Not on  file  . Number of children: Not on file  . Years of education: Not on file  . Highest education level: Not on file  Occupational History  . Not on file  Tobacco Use  . Smoking status: Current Some Day Smoker    Types: Cigarettes  . Smokeless tobacco: Never Used  Substance and Sexual Activity  . Alcohol use: No  . Drug use: No  . Sexual activity: Never    Birth control/protection: Surgical  Other Topics Concern  . Not on file  Social History Narrative  . Not on file   Social Determinants of Health   Financial Resource Strain:   . Difficulty of Paying Living Expenses: Not on file  Food Insecurity:   . Worried About Programme researcher, broadcasting/film/video in the Last Year: Not on file  . Ran Out of Food in the Last Year: Not on file  Transportation Needs:   . Lack of Transportation (Medical): Not on file  . Lack of Transportation (Non-Medical): Not on file  Physical Activity:   . Days of Exercise per Week: Not on file  . Minutes of Exercise per Session: Not on file  Stress:   . Feeling of Stress : Not on file  Social Connections:   . Frequency of Communication with Friends and Family: Not on file  . Frequency of Social Gatherings with Friends and Family: Not on file  . Attends Religious Services: Not on file  .  Active Member of Clubs or Organizations: Not on file  . Attends Banker Meetings: Not on file  . Marital Status: Not on file  Intimate Partner Violence:   . Fear of Current or Ex-Partner: Not on file  . Emotionally Abused: Not on file  . Physically Abused: Not on file  . Sexually Abused: Not on file   Family History  Problem Relation Age of Onset  . Osteoarthritis Mother   . Hypertension Father   . Hypertension Sister     OBJECTIVE:  Vitals:   07/27/20 1116  BP: (!) 162/105  Pulse: 83  Resp: 18  Temp: 98.9 F (37.2 C)  TempSrc: Oral  SpO2: 98%    General appearance: ALERT; in no acute distress.  Head: NCAT Lungs: Normal respiratory effort QQ:PYPPJK  2+ bilaterally. Cap refill < 2 seconds Musculoskeletal:  Inspection: Skin warm, dry, clear and intact.  Bruising noted to lateral aspect of right elbow Palpation: Right trapezius, right elbow, right knee mildly tender to palpation ROM: FROM active and passive Skin: warm and dry Neurologic: Ambulates without difficulty; Sensation intact about the upper/ lower extremities Psychological: alert and cooperative; normal mood and affect  DIAGNOSTIC STUDIES:  No results found.   ASSESSMENT & PLAN:  1. Muscle strain   2. Fall, initial encounter   3. Musculoskeletal pain   4. Acute pain of right knee      Meds ordered this encounter  Medications  . cyclobenzaprine (FLEXERIL) 5 MG tablet    Sig: Take 1 tablet (5 mg total) by mouth 3 (three) times daily as needed for muscle spasms.    Dispense:  30 tablet    Refill:  0    Order Specific Question:   Supervising Provider    Answer:   Merrilee Jansky [[id number]]   Work note provided Continue conservative management of rest, ice, and gentle stretches Take Tylenol as needed for pain relief  Take cyclobenzaprine at nighttime for symptomatic relief. Avoid driving or operating heavy machinery while using medication. Follow up with sports medicine if symptoms persist Return or go to the ER if you have any new or worsening symptoms (fever, chills, chest pain, abdominal pain, changes in bowel or bladder habits, pain radiating into lower legs)   Reviewed expectations re: course of current medical issues. Questions answered. Outlined signs and symptoms indicating need for more acute intervention. Patient verbalized understanding. After Visit Summary given.       Moshe Cipro, NP 07/27/20 1219

## 2020-11-25 ENCOUNTER — Other Ambulatory Visit: Payer: Self-pay

## 2020-11-25 ENCOUNTER — Encounter (HOSPITAL_COMMUNITY): Payer: Self-pay | Admitting: Emergency Medicine

## 2020-11-25 ENCOUNTER — Ambulatory Visit (HOSPITAL_COMMUNITY)
Admission: EM | Admit: 2020-11-25 | Discharge: 2020-11-25 | Disposition: A | Discharge status: Home / Self Care | Payer: Medicaid Other | Attending: Emergency Medicine | Admitting: Emergency Medicine

## 2020-11-25 DIAGNOSIS — S46919A Strain of unspecified muscle, fascia and tendon at shoulder and upper arm level, unspecified arm, initial encounter: Secondary | ICD-10-CM

## 2020-11-25 DIAGNOSIS — T148XXA Other injury of unspecified body region, initial encounter: Secondary | ICD-10-CM

## 2020-11-25 MED ORDER — KETOROLAC TROMETHAMINE 30 MG/ML IJ SOLN
30.0000 mg | Freq: Once | INTRAMUSCULAR | Status: AC
  Administered 2020-11-25: 30 mg via INTRAMUSCULAR

## 2020-11-25 MED ORDER — TIZANIDINE HCL 4 MG PO TABS: 4.0000 mg | ORAL_TABLET | Freq: Four times a day (QID) | ORAL | 0 refills | Status: AC | PRN

## 2020-11-25 MED ORDER — KETOROLAC TROMETHAMINE 30 MG/ML IJ SOLN
INTRAMUSCULAR | Status: AC
Start: 2020-11-25 — End: ?
  Filled 2020-11-25: qty 1

## 2020-11-25 NOTE — Discharge Instructions (Addendum)
Take Ibuprofen and/or Tylenol for pain.  You can also take Naproxen.  Take the Zanaflex as needed for muscle spasms.  Do not take with alcohol or before driving.    Rest, use heat or ice for comfort.    Follow up with your primary care provider.    Return or go to the Emergency Department if symptoms worsen or do not improve in the next few days.

## 2020-11-25 NOTE — ED Triage Notes (Signed)
Pt here for right shoulder and upper back pain into neck after pulling someones legs at work last Monday

## 2020-11-25 NOTE — ED Provider Notes (Signed)
[facility]    CSN: [id number] Arrival date & time: 11/25/20  1133      History   Chief Complaint Chief Complaint  Patient presents with  . Shoulder Pain  . Back Pain    HPI Sara Arias is a 55 y.o. female.   Sara Arias is a 55 y.o. here with chief complaint of neck and bilateral shoulder pain since last Monday.  Reports straining muscles at work.  Has tried icy hot with minimal relief.  Denies any numbness or tingling in extremities.  Denies any loss of bowel or bladder control. Denies any specific alleviating or aggravating factors.  Denies any fevers, chest pain, shortness of breath, N/V/D, numbness, tingling, weakness, abdominal pain, or headaches.   ROS: As per HPI, all other pertinent ROS negative       Shoulder Pain Associated symptoms: back pain   Back Pain   Past Medical History:  Diagnosis Date  . Obesity   . Panic attacks     There are no problems to display for this patient.   Past Surgical History:  Procedure Laterality Date  . PARTIAL HYSTERECTOMY      OB History   No obstetric history on file.      Home Medications    Prior to Admission medications   Medication Sig Start Date End Date Taking? Authorizing Provider  tiZANidine (ZANAFLEX) 4 MG tablet Take 1 tablet (4 mg total) by mouth every 6 (six) hours as needed for up to 5 days for muscle spasms. 11/25/20 11/30/20 Yes Ivette Loyal, NP  aspirin 325 MG tablet Take 650 mg by mouth daily as needed for mild pain or headache.    [provider]  Biotin w/ Vitamins C & E (HAIR SKIN & NAILS GUMMIES) 1250-7.5-7.5 MCG-MG-UNT CHEW Chew 2 each by mouth daily.    [provider]  meloxicam (MOBIC) 15 MG tablet Take 1 tablet (15 mg total) by mouth daily. Patient not taking: Reported on 11/25/2020 01/26/20   Eustace Moore, MD  Phenyleph-Doxylamine-DM-APAP 5-6.25-10-325 MG/15ML LIQD Take 15 mLs by mouth as needed (flu symptoms). Patient not taking: Reported on 11/25/2020     [provider]  sodium-potassium bicarbonate (ALKA-SELTZER GOLD) TBEF dissolvable tablet Take 1 tablet by mouth daily as needed (congestion).    [provider]  fluticasone (FLONASE) 50 MCG/ACT nasal spray Place 2 sprays into both nostrils daily. Patient not taking: Reported on 06/23/2017 07/22/15 03/23/19  Muthersbaugh, Dahlia Client, PA-C    Family History Family History  Problem Relation Age of Onset  . Osteoarthritis Mother   . Hypertension Father   . Hypertension Sister     Social History Social History   Tobacco Use  . Smoking status: Current Some Day Smoker    Types: Cigarettes  . Smokeless tobacco: Never Used  Substance Use Topics  . Alcohol use: No  . Drug use: No     Allergies   Ibuprofen   Review of Systems Review of Systems  Musculoskeletal: Positive for back pain.  All other systems reviewed and are negative.    Physical Exam Triage Vital Signs ED Triage Vitals  Enc Vitals Group     BP 11/25/20 1209 (!) 157/101     Pulse Rate 11/25/20 1209 97     Resp 11/25/20 1209 18     Temp 11/25/20 1209 97.9 F (36.6 C)     Temp Source 11/25/20 1209 Oral     SpO2 11/25/20 1209 99 %  Weight --      Height --      Head Circumference --      Peak Flow --      Pain Score 11/25/20 1210 8     Pain Loc --      Pain Edu? --      Excl. in GC? --    No data found.  Updated Vital Signs BP (!) 157/101 (BP Location: Left Arm)   Pulse 97   Temp 97.9 F (36.6 C) (Oral)   Resp 18   SpO2 99%   Visual Acuity Right Eye Distance:   Left Eye Distance:   Bilateral Distance:    Right Eye Near:   Left Eye Near:    Bilateral Near:     Physical Exam Vitals and nursing note reviewed.  Constitutional:      General: She is not in acute distress.    Appearance: Normal appearance. She is not ill-appearing, toxic-appearing or diaphoretic.  HENT:     Head: Normocephalic and atraumatic.  Eyes:     Conjunctiva/sclera: Conjunctivae normal.   Cardiovascular:     Rate and Rhythm: Normal rate.     Pulses: Normal pulses.  Pulmonary:     Effort: Pulmonary effort is normal.  Abdominal:     General: Abdomen is flat.  Musculoskeletal:     Right shoulder: Normal.     Left shoulder: Normal.     Right upper arm: Normal.     Left upper arm: Normal.     Cervical back: Normal range of motion. Tenderness present. No swelling, edema, deformity, erythema, rigidity, torticollis or bony tenderness. No pain with movement.     Thoracic back: Tenderness present. No swelling, edema, deformity, signs of trauma or bony tenderness. Normal range of motion. No scoliosis.     Lumbar back: Tenderness present. No swelling, edema, deformity, signs of trauma or bony tenderness. Normal range of motion. Negative right straight leg raise test and negative left straight leg raise test. No scoliosis.  Skin:    General: Skin is warm and dry.  Neurological:     General: No focal deficit present.     Mental Status: She is alert and oriented to person, place, and time.  Psychiatric:        Mood and Affect: Mood normal.      UC Treatments / Results  Labs (all labs ordered are listed, but only abnormal results are displayed) Labs Reviewed - No data to display  EKG   Radiology No results found.  Procedures Procedures (including critical care time)  Medications Ordered in UC Medications  ketorolac (TORADOL) 30 MG/ML injection 30 mg (has no administration in time range)    Initial Impression / Assessment and Plan / UC Course  I have reviewed the triage vital signs and the nursing notes.  Pertinent labs & imaging results that were available during my care of the patient were reviewed by me and considered in my medical decision making (see chart for details).     Muscle Strain Strain of shoulder Toradol IM administered in office for pain Take Ibuprofen/Naproxen and/or Tylenol for pain Zanaflex PRN at bedtime Use heat/ice for comfort Rest for  a few days.  Follow up with PCP as needed  Final Clinical Impressions(s) / UC Diagnoses   Final diagnoses:  Strain of shoulder, unspecified laterality, initial encounter  Muscle strain     Discharge Instructions     Take Ibuprofen and/or Tylenol for pain.  You can also take  Naproxen.  Take the Zanaflex as needed for muscle spasms.  Do not take with alcohol or before driving.    Rest, use heat or ice for comfort.    Follow up with your primary care provider.    Return or go to the Emergency Department if symptoms worsen or do not improve in the next few days.      ED Prescriptions    Medication Sig Dispense Auth. Provider   tiZANidine (ZANAFLEX) 4 MG tablet Take 1 tablet (4 mg total) by mouth every 6 (six) hours as needed for up to 5 days for muscle spasms. 20 tablet Ivette Loyal, NP     PDMP not reviewed this encounter.   Ivette Loyal, NP 11/25/20 1251

## 2021-01-20 ENCOUNTER — Ambulatory Visit (HOSPITAL_COMMUNITY)
Admission: EM | Admit: 2021-01-20 | Discharge: 2021-01-20 | Disposition: A | Discharge status: Home / Self Care | Payer: Medicaid Other

## 2021-01-20 ENCOUNTER — Other Ambulatory Visit: Payer: Self-pay

## 2021-01-20 ENCOUNTER — Encounter (HOSPITAL_COMMUNITY): Payer: Self-pay | Admitting: Emergency Medicine

## 2021-01-20 DIAGNOSIS — M1711 Unilateral primary osteoarthritis, right knee: Secondary | ICD-10-CM

## 2021-01-20 HISTORY — DX: Pain in unspecified knee: M25.569

## 2021-01-20 MED ORDER — NAPROXEN 500 MG PO TABS: 500.0000 mg | ORAL_TABLET | Freq: Two times a day (BID) | ORAL | 0 refills | Status: DC

## 2021-01-20 MED ORDER — TIZANIDINE HCL 4 MG PO TABS: 4.0000 mg | ORAL_TABLET | Freq: Every day | ORAL | 0 refills | Status: DC

## 2021-01-20 NOTE — Discharge Instructions (Signed)
Take naproxen twice a day for the next 7 days then as needed for additional comfort  Can use muscle relaxer at bedtime, please be mindful this medication make you drowsy  There are 2 orthopedic specialist listed below, please follow-up with one of them to establish care for long-term management of your arthritis in your knee

## 2021-01-20 NOTE — ED Triage Notes (Signed)
Chronic right knee pain.  Right knee is hurting today.  Patient has received injections in knee and this seems to help.  Patient reports she walks for exercise.  No recent injury.    Patient requests a work note

## 2021-01-20 NOTE — ED Provider Notes (Signed)
[facility]    CSN: [id number] Arrival date & time: 01/20/21  0820      History   Chief Complaint Chief Complaint  Patient presents with  . Knee Pain    HPI Sara Arias is a 55 y.o. female.   Patient presents with chronic right knee pain that has been aching for months and progressively getting worse over the last 2 to 3 weeks.  Has osteoarthritis of the knee.  Range of motion intact.  Denies numbness and tingling.  Pain has begun to radiate down the leg.  Has been using over-the-counter icy hot doing daily morning walks to help manage her arthritis.  Is recently started a new job and is unable to use menthol cream due to smell.  Has received steroid injection to the site previously which did give some relief. Denies any new injury. Requesting work note for the day to rest.   Past Medical History:  Diagnosis Date  . Knee pain   . Obesity   . Panic attacks     There are no problems to display for this patient.   Past Surgical History:  Procedure Laterality Date  . PARTIAL HYSTERECTOMY    . SHOULDER SURGERY      OB History   No obstetric history on file.      Home Medications    Prior to Admission medications   Medication Sig Start Date End Date Taking? Authorizing Provider  Camphor-Menthol-Methyl Sal (SALONPAS EX) Apply topically.   Yes [provider]  naproxen (NAPROSYN) 500 MG tablet Take 1 tablet (500 mg total) by mouth 2 (two) times daily. 01/20/21  Yes Salli Quarry R, NP  aspirin 325 MG tablet Take 650 mg by mouth daily as needed for mild pain or headache.    [provider]  Biotin w/ Vitamins C & E (HAIR SKIN & NAILS GUMMIES) 1250-7.5-7.5 MCG-MG-UNT CHEW Chew 2 each by mouth daily.    [provider]  meloxicam (MOBIC) 15 MG tablet Take 1 tablet (15 mg total) by mouth daily. Patient not taking: Reported on 11/25/2020 01/26/20   Eustace Moore, MD  Phenyleph-Doxylamine-DM-APAP 5-6.25-10-325 MG/15ML LIQD Take 15 mLs  by mouth as needed (flu symptoms). Patient not taking: Reported on 11/25/2020    [provider]  sodium-potassium bicarbonate (ALKA-SELTZER GOLD) TBEF dissolvable tablet Take 1 tablet by mouth daily as needed (congestion).    [provider]  tiZANidine (ZANAFLEX) 4 MG tablet Take 1 tablet (4 mg total) by mouth at bedtime. 01/20/21   Enrigue Hashimi, Elita Boone, NP  fluticasone (FLONASE) 50 MCG/ACT nasal spray Place 2 sprays into both nostrils daily. Patient not taking: Reported on 06/23/2017 07/22/15 03/23/19  Muthersbaugh, Dahlia Client, PA-C    Family History Family History  Problem Relation Age of Onset  . Osteoarthritis Mother   . Hypertension Father   . Hypertension Sister     Social History Social History   Tobacco Use  . Smoking status: Former Smoker    Types: Cigarettes  . Smokeless tobacco: Never Used  Vaping Use  . Vaping Use: Never used  Substance Use Topics  . Alcohol use: No  . Drug use: No     Allergies   Ibuprofen   Review of Systems Review of Systems  Defer to HPI   Physical Exam Triage Vital Signs ED Triage Vitals  Enc Vitals Group     BP 01/20/21 0932 (!) 154/78     Pulse Rate 01/20/21 0932 75  Resp 01/20/21 0932 20     Temp 01/20/21 0932 98.3 F (36.8 C)     Temp Source 01/20/21 0932 Oral     SpO2 01/20/21 0932 100 %     Weight --      Height --      Head Circumference --      Peak Flow --      Pain Score 01/20/21 0925 10     Pain Loc --      Pain Edu? --      Excl. in GC? --    No data found.  Updated Vital Signs BP (!) 154/78 (BP Location: Right Arm) Comment (BP Location): large cuff  Pulse 75   Temp 98.3 F (36.8 C) (Oral)   Resp 20   SpO2 100%   Visual Acuity Right Eye Distance:   Left Eye Distance:   Bilateral Distance:    Right Eye Near:   Left Eye Near:    Bilateral Near:     Physical Exam Constitutional:      Appearance: Normal appearance. She is obese.  HENT:     Head: Normocephalic.  Eyes:      Extraocular Movements: Extraocular movements intact.  Pulmonary:     Effort: Pulmonary effort is normal.  Musculoskeletal:     Right lower leg: 1+ Pitting Edema present.     Left lower leg: 1+ Pitting Edema present.     Comments: No swelling noted today.  Has diffuse tenderness across the entirety of anterior knee, denies pain to bilateral lateral and posterior knee.  2+ popliteal pulse.  No effusion noted.  Skin:    General: Skin is warm and dry.  Neurological:     Mental Status: She is alert. Mental status is at baseline. She is disoriented.  Psychiatric:        Mood and Affect: Mood normal.        Behavior: Behavior normal.        Thought Content: Thought content normal.        Judgment: Judgment normal.      UC Treatments / Results  Labs (all labs ordered are listed, but only abnormal results are displayed) Labs Reviewed - No data to display  EKG   Radiology No results found.  Procedures Procedures (including critical care time)  Medications Ordered in UC Medications - No data to display  Initial Impression / Assessment and Plan / UC Course  I have reviewed the triage vital signs and the nursing notes.  Pertinent labs & imaging results that were available during my care of the patient were reviewed by me and considered in my medical decision making (see chart for details).  Osteoarthritis of right knee  1. Naproxen 500 mg twice a day 2. zanaflex 4mg  at bedtime prn  3. Follow up with orthopedic specialist ot establish long term care, given 2 resources  4. Continue daily walks to assist with management  Final Clinical Impressions(s) / UC Diagnoses   Final diagnoses:  Primary osteoarthritis of right knee     Discharge Instructions     Take naproxen twice a day for the next 7 days then as needed for additional comfort  Can use muscle relaxer at bedtime, please be mindful this medication make you drowsy  There are 2 orthopedic specialist listed below,  please follow-up with one of them to establish care for long-term management of your arthritis in your knee    ED Prescriptions    Medication Sig Dispense Auth.  Provider   tiZANidine (ZANAFLEX) 4 MG tablet Take 1 tablet (4 mg total) by mouth at bedtime. 20 tablet Marguita Venning R, NP   naproxen (NAPROSYN) 500 MG tablet Take 1 tablet (500 mg total) by mouth 2 (two) times daily. 30 tablet Valinda Hoar, NP     PDMP not reviewed this encounter.   Valinda Hoar, NP 01/20/21 1035

## 2021-07-25 ENCOUNTER — Ambulatory Visit (HOSPITAL_COMMUNITY)
Admission: EM | Admit: 2021-07-25 | Discharge: 2021-07-25 | Disposition: A | Discharge status: Home / Self Care | Payer: Medicaid Other | Attending: Internal Medicine | Admitting: Internal Medicine

## 2021-07-25 ENCOUNTER — Other Ambulatory Visit: Payer: Self-pay

## 2021-07-25 ENCOUNTER — Encounter (HOSPITAL_COMMUNITY): Payer: Self-pay | Admitting: Emergency Medicine

## 2021-07-25 DIAGNOSIS — B349 Viral infection, unspecified: Secondary | ICD-10-CM

## 2021-07-25 DIAGNOSIS — S63501A Unspecified sprain of right wrist, initial encounter: Secondary | ICD-10-CM

## 2021-07-25 MED ORDER — DEXAMETHASONE SODIUM PHOSPHATE 10 MG/ML IJ SOLN
10.0000 mg | Freq: Once | INTRAMUSCULAR | Status: AC
  Administered 2021-07-25: 10 mg via INTRAMUSCULAR

## 2021-07-25 MED ORDER — METHOCARBAMOL 500 MG PO TABS: 500.0000 mg | ORAL_TABLET | Freq: Every evening | ORAL | 0 refills | Status: DC | PRN

## 2021-07-25 MED ORDER — DEXAMETHASONE SODIUM PHOSPHATE 10 MG/ML IJ SOLN
INTRAMUSCULAR | Status: AC
Start: 2021-07-25 — End: ?
  Filled 2021-07-25: qty 1

## 2021-07-25 NOTE — ED Provider Notes (Signed)
[facility]    CSN: [id number] Arrival date & time: 07/25/21  1542      History   Chief Complaint Chief Complaint  Patient presents with   Generalized Body Aches   Arm Swelling    Right      HPI Sara Arias is a 55 y.o. female comes to the urgent care with generalized body aches of 1 week duration.  Patient's symptoms started a week ago.  She denies any fever or chills.  No shortness of breath or wheezing.  No sore throat.  Patient tested negative for COVID-19.  Patient also complains of right arm swelling.  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  Patient is complaining of right hand pain with some swelling.  No numbness or tingling.  No weakness in the right upper extremity.Marland Kitchen   HPI  Past Medical History:  Diagnosis Date   Knee pain    Obesity    Panic attacks     There are no problems to display for this patient.   Past Surgical History:  Procedure Laterality Date   PARTIAL HYSTERECTOMY     SHOULDER SURGERY      OB History   No obstetric history on file.      Home Medications    Prior to Admission medications   Medication Sig Start Date End Date Taking? Authorizing Provider  methocarbamol (ROBAXIN) 500 MG tablet Take 1 tablet (500 mg total) by mouth at bedtime as needed for muscle spasms. 07/25/21  Yes Verda Mehta, Britta Mccreedy, MD  aspirin 325 MG tablet Take 650 mg by mouth daily as needed for mild pain or headache.    [provider]  Biotin w/ Vitamins C & E (HAIR SKIN & NAILS GUMMIES) 1250-7.5-7.5 MCG-MG-UNT CHEW Chew 2 each by mouth daily.    [provider]  Camphor-Menthol-Methyl Sal (SALONPAS EX) Apply topically.    [provider]  naproxen (NAPROSYN) 500 MG tablet Take 1 tablet (500 mg total) by mouth 2 (two) times daily. 01/20/21   White, Elita Boone, NP  sodium-potassium bicarbonate (ALKA-SELTZER GOLD) TBEF dissolvable tablet Take 1 tablet by mouth daily as needed  (congestion).    [provider]  tiZANidine (ZANAFLEX) 4 MG tablet Take 1 tablet (4 mg total) by mouth at bedtime. 01/20/21   White, Elita Boone, NP  fluticasone (FLONASE) 50 MCG/ACT nasal spray Place 2 sprays into both nostrils daily. Patient not taking: Reported on 06/23/2017 07/22/15 03/23/19  Muthersbaugh, Dahlia Client, PA-C    Family History Family History  Problem Relation Age of Onset   Osteoarthritis Mother    Hypertension Father    Hypertension Sister     Social History Social History   Tobacco Use   Smoking status: Former    Types: Cigarettes   Smokeless tobacco: Never  Vaping Use   Vaping Use: Never used  Substance Use Topics   Alcohol use: No   Drug use: No     Allergies   Ibuprofen   Review of Systems Review of Systems As per HPI  Physical Exam Triage Vital Signs ED Triage Vitals  Enc Vitals Group     BP 07/25/21 1825 (!) 168/100     Pulse Rate 07/25/21 1825 83     Resp 07/25/21 1825 18     Temp 07/25/21 1825 97.7 F (36.5 C)     Temp Source 07/25/21 1825 Oral     SpO2 07/25/21 1825 98 %  Weight --      Height --      Head Circumference --      Peak Flow --      Pain Score 07/25/21 1823 10     Pain Loc --      Pain Edu? --      Excl. in GC? --    No data found.  Updated Vital Signs BP (!) 168/100   Pulse 83   Temp 97.7 F (36.5 C) (Oral)   Resp 18   SpO2 98%   Visual Acuity Right Eye Distance:   Left Eye Distance:   Bilateral Distance:    Right Eye Near:   Left Eye Near:    Bilateral Near:     Physical Exam Vitals and nursing note reviewed.  Constitutional:      General: She is not in acute distress.    Appearance: She is not ill-appearing.  Cardiovascular:     Rate and Rhythm: Normal rate and regular rhythm.     Pulses: Normal pulses.     Heart sounds: Normal heart sounds.  Musculoskeletal:     Comments: Tenderness on the right wrist.  Full range of motion.  Hand grip is normal.  Neurological:     Mental Status:  She is alert.     UC Treatments / Results  Labs (all labs ordered are listed, but only abnormal results are displayed) Labs Reviewed - No data to display  EKG   Radiology No results found.  Procedures Procedures (including critical care time)  Medications Ordered in UC Medications  dexamethasone (DECADRON) injection 10 mg (10 mg Intramuscular Given 07/25/21 1929)    Initial Impression / Assessment and Plan / UC Course  I have reviewed the triage vital signs and the nursing notes.  Pertinent labs & imaging results that were available during my care of the patient were reviewed by me and considered in my medical decision making (see chart for details).     1.  Right wrist sprain: Cannot tolerate NSAIDs. Dexamethasone 10 mg IM x1 dose Robaxin as needed for muscle spasm Wrist brace is recommended Gentle range of motion exercises Return precautions given:  2.  Generalized body aches likely secondary to viral illness Home COVID test is negative No indication for flu testing at this time given the duration of symptoms.  Return precautions given Maintain adequate hydration.  Final Clinical Impressions(s) / UC Diagnoses   Final diagnoses:  Right wrist sprain, initial encounter  Viral illness     Discharge Instructions      This take medications as prescribed Gentle range of motion exercises Wear the wrist brace at bedtime Do not drive or operate heavy machinery after taking muscle relaxant.     ED Prescriptions     Medication Sig Dispense Auth. Provider   methocarbamol (ROBAXIN) 500 MG tablet Take 1 tablet (500 mg total) by mouth at bedtime as needed for muscle spasms. 10 tablet Prithvi Kooi, Britta Mccreedy, MD      PDMP not reviewed this encounter.   Merrilee Jansky, MD 07/26/21 [phone]

## 2021-07-25 NOTE — ED Triage Notes (Signed)
Pt is present today with body aches. Pt sx started one week ago  Pt states that she also has noticed right arm swelling. 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

## 2021-07-25 NOTE — Discharge Instructions (Addendum)
This take medications as prescribed Gentle range of motion exercises Wear the wrist brace at bedtime Do not drive or operate heavy machinery after taking muscle relaxant.

## 2021-11-03 ENCOUNTER — Other Ambulatory Visit: Payer: Self-pay

## 2021-11-03 ENCOUNTER — Ambulatory Visit (HOSPITAL_COMMUNITY)
Admission: EM | Admit: 2021-11-03 | Discharge: 2021-11-03 | Disposition: A | Discharge status: Home / Self Care | Payer: 59 | Attending: Family Medicine | Admitting: Family Medicine

## 2021-11-03 ENCOUNTER — Ambulatory Visit (INDEPENDENT_AMBULATORY_CARE_PROVIDER_SITE_OTHER): Payer: 59

## 2021-11-03 ENCOUNTER — Encounter (HOSPITAL_COMMUNITY): Payer: Self-pay

## 2021-11-03 DIAGNOSIS — R079 Chest pain, unspecified: Secondary | ICD-10-CM

## 2021-11-03 DIAGNOSIS — R0602 Shortness of breath: Secondary | ICD-10-CM | POA: Diagnosis not present

## 2021-11-03 DIAGNOSIS — R072 Precordial pain: Secondary | ICD-10-CM | POA: Insufficient documentation

## 2021-11-03 DIAGNOSIS — Z20822 Contact with and (suspected) exposure to covid-19: Secondary | ICD-10-CM | POA: Diagnosis not present

## 2021-11-03 LAB — SARS CORONAVIRUS 2 (TAT 6-24 HRS): SARS Coronavirus 2: NEGATIVE

## 2021-11-03 MED ORDER — ALBUTEROL SULFATE HFA 108 (90 BASE) MCG/ACT IN AERS: 1.0000 | INHALATION_SPRAY | RESPIRATORY_TRACT | 0 refills | Status: DC | PRN

## 2021-11-03 NOTE — ED Triage Notes (Signed)
Pt c/o upper center chest tightness with SOB at times for over a week. 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 Pt requesting COVID testing. States pain is worse when around the smoke. C/o some coughing. Denies pain at this time. ?

## 2021-11-03 NOTE — Discharge Instructions (Addendum)
Your EKG was normal ?The x-ray was normal ? ? ?You have been swabbed for COVID, and the test will result in the next 24 hours. Our staff will call you if positive. If the test is positive, you should quarantine for 5 days.  ?

## 2021-11-03 NOTE — ED Provider Notes (Signed)
?[facility] ? ? ? ?CSN: [id number] ?Arrival date & time: 11/03/21  1326 ? ? ?  ? ?History   ?Chief Complaint ?Chief Complaint  ?Patient presents with  ? Chest Pain  ? Shortness of Breath  ? ? ?HPI ?Sara Arias is a 56 y.o. female.  ? ? ?Chest Pain ?Associated symptoms: shortness of breath   ?Shortness of Breath ?Associated symptoms: chest pain   ?Here for some central chest pain that she has been having in the last week.  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  She has maybe noticed a little shortness of breath.  No wheezing noted.  No fever or chills.  She maybe had a little sore throat last week, but none now.  No cough or congestion. ? ?She has had bronchitis when she is around smoke, but states she has never been known to have asthma ? ?Past Medical History:  ?Diagnosis Date  ? Knee pain   ? Obesity   ? Panic attacks   ? ? ?There are no problems to display for this patient. ? ? ?Past Surgical History:  ?Procedure Laterality Date  ? PARTIAL HYSTERECTOMY    ? SHOULDER SURGERY    ? ? ?OB History   ?No obstetric history on file. ?  ? ? ? ?Home Medications   ? ?Prior to Admission medications   ?Medication Sig Start Date End Date Taking? Authorizing Provider  ?albuterol (VENTOLIN HFA) 108 (90 Base) MCG/ACT inhaler Inhale 1-2 puffs into the lungs every 4 (four) hours as needed for wheezing or shortness of breath. 11/03/21  Yes Zenia Resides, MD  ?aspirin 325 MG tablet Take 650 mg by mouth daily as needed for mild pain or headache.    [provider]  ?Biotin w/ Vitamins C & E (HAIR SKIN & NAILS GUMMIES) 1250-7.5-7.5 MCG-MG-UNT CHEW Chew 2 each by mouth daily.    [provider]  ?Camphor-Menthol-Methyl Sal (SALONPAS EX) Apply topically.    [provider]  ?methocarbamol (ROBAXIN) 500 MG tablet Take 1 tablet (500 mg total) by mouth at bedtime as needed for muscle spasms. 07/25/21   Merrilee Jansky, MD  ?naproxen (NAPROSYN) 500 MG tablet  Take 1 tablet (500 mg total) by mouth 2 (two) times daily. 01/20/21   Valinda Hoar, NP  ?sodium-potassium bicarbonate (ALKA-SELTZER GOLD) TBEF dissolvable tablet Take 1 tablet by mouth daily as needed (congestion).    [provider]  ?tiZANidine (ZANAFLEX) 4 MG tablet Take 1 tablet (4 mg total) by mouth at bedtime. 01/20/21   Valinda Hoar, NP  ?fluticasone (FLONASE) 50 MCG/ACT nasal spray Place 2 sprays into both nostrils daily. ?Patient not taking: Reported on 06/23/2017 07/22/15 03/23/19  Muthersbaugh, Dahlia Client, PA-C  ? ? ?Family History ?Family History  ?Problem Relation Age of Onset  ? Osteoarthritis Mother   ? Hypertension Father   ? Hypertension Sister   ? ? ?Social History ?Social History  ? ?Tobacco Use  ? Smoking status: Former  ?  Types: Cigarettes  ? Smokeless tobacco: Never  ?Vaping Use  ? Vaping Use: Never used  ?Substance Use Topics  ? Alcohol use: No  ? Drug use: No  ? ? ? ?Allergies   ?Ibuprofen ? ? ?Review of Systems ?Review of Systems  ?Respiratory:  Positive for shortness of breath.   ?Cardiovascular:  Positive for chest pain.  ? ? ?Physical Exam ?Triage Vital Signs ?ED Triage Vitals  ?Enc Vitals Group  ?  BP 11/03/21 1337 (!) 166/94  ?   Pulse Rate 11/03/21 1337 86  ?   Resp 11/03/21 1337 18  ?   Temp 11/03/21 1337 98.4 ?F (36.9 ?C)  ?   Temp Source 11/03/21 1337 Oral  ?   SpO2 11/03/21 1337 99 %  ?   Weight --   ?   Height --   ?   Head Circumference --   ?   Peak Flow --   ?   Pain Score 11/03/21 1338 0  ?   Pain Loc --   ?   Pain Edu? --   ?   Excl. in GC? --   ? ?No data found. ? ?Updated Vital Signs ?BP (!) 166/94 (BP Location: Right Arm)   Pulse 86   Temp 98.4 ?F (36.9 ?C) (Oral)   Resp 18   SpO2 99%  ? ?Visual Acuity ?Right Eye Distance:   ?Left Eye Distance:   ?Bilateral Distance:   ? ?Right Eye Near:   ?Left Eye Near:    ?Bilateral Near:    ? ?Physical Exam ?Vitals reviewed.  ?Constitutional:   ?   General: She is not in acute distress. ?   Appearance: She is not  toxic-appearing.  ?HENT:  ?   Right Ear: Tympanic membrane and ear canal normal.  ?   Left Ear: Tympanic membrane and ear canal normal.  ?   Nose: Nose normal.  ?   Mouth/Throat:  ?   Mouth: Mucous membranes are moist.  ?   Pharynx: No oropharyngeal exudate or posterior oropharyngeal erythema.  ?Eyes:  ?   Extraocular Movements: Extraocular movements intact.  ?   Conjunctiva/sclera: Conjunctivae normal.  ?   Pupils: Pupils are equal, round, and reactive to light.  ?Cardiovascular:  ?   Rate and Rhythm: Normal rate and regular rhythm.  ?   Heart sounds: No murmur heard. ?Pulmonary:  ?   Effort: Pulmonary effort is normal. No respiratory distress.  ?   Breath sounds: No wheezing, rhonchi or rales.  ?Chest:  ?   Chest wall: No tenderness.  ?Musculoskeletal:  ?   Cervical back: Neck supple.  ?Lymphadenopathy:  ?   Cervical: No cervical adenopathy.  ?Skin: ?   Capillary Refill: Capillary refill takes less than 2 seconds.  ?   Coloration: Skin is not jaundiced or pale.  ?Neurological:  ?   General: No focal deficit present.  ?   Mental Status: She is alert and oriented to person, place, and time.  ?Psychiatric:     ?   Behavior: Behavior normal.  ? ? ? ?UC Treatments / Results  ?Labs ?(all labs ordered are listed, but only abnormal results are displayed) ?Labs Reviewed  ?SARS CORONAVIRUS 2 (TAT 6-24 HRS)  ? ? ?EKG ? ? ?Radiology ?DG Chest 2 View ? ?Result Date: 11/03/2021 ?CLINICAL DATA:  Chest pain. EXAM: CHEST - 2 VIEW COMPARISON:  Chest x-ray 10/31/2018. FINDINGS: The heart size and mediastinal contours are within normal limits. Both lungs are clear. The visualized skeletal structures are unremarkable. IMPRESSION: No active cardiopulmonary disease. Electronically Signed   By: Darliss Cheney M.D.   On: 11/03/2021 15:42   ? ?Procedures ?Procedures (including critical care time) ? ?Medications Ordered in UC ?Medications - No data to display ? ?Initial Impression / Assessment and Plan / UC Course  ?I have reviewed the triage  vital signs and the nursing notes. ? ?Pertinent labs & imaging results that were available during my care  of the patient were reviewed by me and considered in my medical decision making (see chart for details). ? ?  ? ?EKG is normal, with NSR ?Chest x-ray is also benign.  She wants to be swabbed for COVID.  If she is positive she would be past the time of receiving oral antivirals. ? ?She will try an inhaler to see if that helps when she has the chest pain when she is around smok ? ?Final Clinical Impressions(s) / UC Diagnoses  ? ?Final diagnoses:  ?Precordial pain  ? ? ? ?Discharge Instructions   ? ?  ?Your EKG was normal ?The x-ray was normal ? ? ?You have been swabbed for COVID, and the test will result in the next 24 hours. Our staff will call you if positive. If the test is positive, you should quarantine for 5 days.  ? ? ? ? ?ED Prescriptions   ? ? Medication Sig Dispense Auth. Provider  ? albuterol (VENTOLIN HFA) 108 (90 Base) MCG/ACT inhaler Inhale 1-2 puffs into the lungs every 4 (four) hours as needed for wheezing or shortness of breath. 1 each Zenia Resides, MD  ? ?  ? ?PDMP not reviewed this encounter. ?  ?Zenia Resides, MD ?11/03/21 1600 ? ?

## 2021-12-23 ENCOUNTER — Ambulatory Visit (HOSPITAL_COMMUNITY)
Admission: EM | Admit: 2021-12-23 | Discharge: 2021-12-23 | Disposition: A | Discharge status: Home / Self Care | Payer: 59 | Attending: Internal Medicine | Admitting: Internal Medicine

## 2021-12-23 ENCOUNTER — Other Ambulatory Visit: Payer: Self-pay

## 2021-12-23 ENCOUNTER — Encounter (HOSPITAL_COMMUNITY): Payer: Self-pay | Admitting: Emergency Medicine

## 2021-12-23 DIAGNOSIS — Z20822 Contact with and (suspected) exposure to covid-19: Secondary | ICD-10-CM | POA: Diagnosis not present

## 2021-12-23 DIAGNOSIS — J069 Acute upper respiratory infection, unspecified: Secondary | ICD-10-CM | POA: Insufficient documentation

## 2021-12-23 LAB — POC INFLUENZA A AND B ANTIGEN (URGENT CARE ONLY)
INFLUENZA A ANTIGEN, POC: NEGATIVE
INFLUENZA B ANTIGEN, POC: NEGATIVE

## 2021-12-23 LAB — SARS CORONAVIRUS 2 (TAT 6-24 HRS): SARS Coronavirus 2: NEGATIVE

## 2021-12-23 LAB — POCT RAPID STREP A, ED / UC: Streptococcus, Group A Screen (Direct): NEGATIVE

## 2021-12-23 MED ORDER — FLUTICASONE PROPIONATE 50 MCG/ACT NA SUSP
1.0000 | Freq: Every day | NASAL | 2 refills | Status: DC
Start: 2021-12-23 — End: 2022-11-15

## 2021-12-23 MED ORDER — ACETAMINOPHEN 500 MG PO TABS: 500.0000 mg | ORAL_TABLET | Freq: Four times a day (QID) | ORAL | 0 refills | Status: DC | PRN

## 2021-12-23 MED ORDER — GUAIFENESIN ER 1200 MG PO TB12: 1200.0000 mg | ORAL_TABLET | Freq: Two times a day (BID) | ORAL | 0 refills | Status: DC

## 2021-12-23 NOTE — Discharge Instructions (Addendum)
You were seen today urgent care for an upper respiratory infection.  I believe that this is viral and will resolve on its own in the next 3 to 4 days.  Please continue taking Tylenol for your headache and fever. Please stop taking NyQuil as this contains Tylenol and this medication and will make you very drowsy will not help your symptoms as much as the prescriptions that I have given you today. ? ?I have prescribed guaifenesin as needed for you to take twice daily to help you cough up your mucus and blow it out of your nose.  This medication only works when you drink plenty of fluids.  Please use Flonase nasal spray 1 spray into each nostril once daily for your nasal congestion/drainage. ? ?I have given you a work note for the next 2 days to stay at home and rest so that you can recover from this virus.  COVID-19 testing is pending.  You will hear from [place] if it is positive.  You will not hear from [place] if it is negative. ? ?Return if you develop any new or worsening symptoms.  If your symptoms are severe, please go to the emergency room such as chest pain, shortness of breath, or heart palpitations.  Please follow-up with your primary care provider for further evaluation and management of your symptoms as well as ongoing wellness visits. ? ?You may return to work once you have been fever free for 24 hours with no Tylenol in your system.   ? ?I hope you feel better! ?

## 2021-12-23 NOTE — ED Provider Notes (Signed)
?[facility] ? ? ? ?CSN: [id number] ?Arrival date & time: 12/23/21  0801 ? ? ?  ? ?History   ?Chief Complaint ?Chief Complaint  ?Patient presents with  ? Sore Throat  ? Fever  ? Headache  ? ? ?HPI ?Sara Arias is a 56 y.o. female.  ? ?Patient presents to urgent care for evaluation of sore throat, headache, cough, body aches, nasal congestion, and low fever for the last 2 days. Highest temp at home reported was 99.5 oral. She may have been exposed at work at [employer] and by her dad when she visited him at [employer]. States she has been vaccinated for COVID-19, but has not received the bivalent booster from [employer] yet. Also vaccinated for influenza last year. Rates her headache and sore throat pain at a 10 on a scale of 0-10. Patient took tylenol this morning for her fever and nyquil 2 days ago for her symptoms which helped her sleep. Reports dry cough with small amount of nasal congestion. Headache is localized to her forehead and is throbbing/tight in nature.  Denies taking any other medications or any other aggravating or relieving factors.  ? ? ?Sore Throat ?Associated symptoms include headaches.  ?Fever ?Associated symptoms: headaches   ?Headache ?Associated symptoms: fever   ? ?Past Medical History:  ?Diagnosis Date  ? Knee pain   ? Obesity   ? Panic attacks   ? ? ?There are no problems to display for this patient. ? ? ?Past Surgical History:  ?Procedure Laterality Date  ? PARTIAL HYSTERECTOMY    ? SHOULDER SURGERY    ? ? ?OB History   ?No obstetric history on file. ?  ? ? ? ?Home Medications   ? ?Prior to Admission medications   ?Medication Sig Start Date End Date Taking? Authorizing Provider  ?acetaminophen (TYLENOL) 500 MG tablet Take 1 tablet (500 mg total) by mouth every 6 (six) hours as needed. 12/23/21  Yes Carlisle Beers, FNP  ?fluticasone (FLONASE) 50 MCG/ACT nasal spray Place 1 spray into both nostrils daily. 12/23/21  Yes Carlisle Beers, FNP  ?Guaifenesin 1200  MG TB12 Take 1 tablet (1,200 mg total) by mouth in the morning and at bedtime. 12/23/21  Yes Carlisle Beers, FNP  ?albuterol (VENTOLIN HFA) 108 (90 Base) MCG/ACT inhaler Inhale 1-2 puffs into the lungs every 4 (four) hours as needed for wheezing or shortness of breath. 11/03/21   Zenia Resides, MD  ?aspirin 325 MG tablet Take 650 mg by mouth daily as needed for mild pain or headache.    [provider]  ?Biotin w/ Vitamins C & E (HAIR SKIN & NAILS GUMMIES) 1250-7.5-7.5 MCG-MG-UNT CHEW Chew 2 each by mouth daily.    [provider]  ?Camphor-Menthol-Methyl Sal (SALONPAS EX) Apply topically.    [provider]  ?methocarbamol (ROBAXIN) 500 MG tablet Take 1 tablet (500 mg total) by mouth at bedtime as needed for muscle spasms. 07/25/21   Merrilee Jansky, MD  ?naproxen (NAPROSYN) 500 MG tablet Take 1 tablet (500 mg total) by mouth 2 (two) times daily. 01/20/21   Valinda Hoar, NP  ?sodium-potassium bicarbonate (ALKA-SELTZER GOLD) TBEF dissolvable tablet Take 1 tablet by mouth daily as needed (congestion).    [provider]  ?tiZANidine (ZANAFLEX) 4 MG tablet Take 1 tablet (4 mg total) by mouth at bedtime. 01/20/21   Valinda Hoar, NP  ? ? ?Family History ?Family History  ?Problem Relation Age of Onset  ?  Osteoarthritis Mother   ? Hypertension Father   ? Hypertension Sister   ? ? ?Social History ?Social History  ? ?Tobacco Use  ? Smoking status: Former  ?  Types: Cigarettes  ? Smokeless tobacco: Never  ?Vaping Use  ? Vaping Use: Never used  ?Substance Use Topics  ? Alcohol use: No  ? Drug use: No  ? ? ? ?Allergies   ?Ibuprofen ? ? ?Review of Systems ?Review of Systems  ?Constitutional:  Positive for fever.  ?Neurological:  Positive for headaches.  ?Per HPI ? ?Physical Exam ?Triage Vital Signs ?ED Triage Vitals  ?Enc Vitals Group  ?   BP 12/23/21 0825 (!) 146/83  ?   Pulse Rate 12/23/21 0825 76  ?   Resp 12/23/21 0825 17  ?   Temp --   ?   Temp src --   ?   SpO2  12/23/21 0825 98 %  ?   Weight --   ?   Height 12/23/21 0825 5\' 4"  (1.626 m)  ?   Head Circumference --   ?   Peak Flow --   ?   Pain Score 12/23/21 0824 10  ?   Pain Loc --   ?   Pain Edu? --   ?   Excl. in GC? --   ? ?No data found. ? ?Updated Vital Signs ?BP (!) 146/83 (BP Location: Left Arm)   Pulse 76   Resp 17   Ht 5\' 4"  (1.626 m)   SpO2 98%   BMI 36.90 kg/m?  ? ?Visual Acuity ?Right Eye Distance:   ?Left Eye Distance:   ?Bilateral Distance:   ? ?Right Eye Near:   ?Left Eye Near:    ?Bilateral Near:    ? ?Physical Exam ?Vitals and nursing note reviewed.  ?Constitutional:   ?   General: She is not in acute distress. ?   Appearance: Normal appearance. She is well-developed. She is not ill-appearing.  ?HENT:  ?   Head: Normocephalic and atraumatic.  ?   Right Ear: Tympanic membrane, ear canal and external ear normal.  ?   Left Ear: Tympanic membrane, ear canal and external ear normal.  ?   Nose: Rhinorrhea present. Rhinorrhea is clear.  ?   Right Turbinates: Swollen and pale.  ?   Left Turbinates: Swollen and pale.  ?   Mouth/Throat:  ?   Mouth: Mucous membranes are moist.  ?   Pharynx: Oropharynx is clear. Posterior oropharyngeal erythema present. No oropharyngeal exudate.  ?   Comments: Mild erythema to posterior oropharynx with evidence of minimal postnasal drainage. ?Eyes:  ?   General: Lids are normal. Vision grossly intact.  ?   Extraocular Movements: Extraocular movements intact.  ?   Conjunctiva/sclera: Conjunctivae normal.  ?   Right eye: Right conjunctiva is not injected.  ?   Left eye: Left conjunctiva is not injected.  ?   Pupils: Pupils are equal, round, and reactive to light.  ?Cardiovascular:  ?   Rate and Rhythm: Normal rate and regular rhythm.  ?   Heart sounds: Normal heart sounds. No murmur heard. ?  No friction rub. No gallop.  ?   Comments: Stable cardiopulmonary exam. ?Pulmonary:  ?   Effort: Pulmonary effort is normal. No respiratory distress.  ?   Breath sounds: Normal breath sounds.  No wheezing, rhonchi or rales.  ?Chest:  ?   Chest wall: No tenderness.  ?Abdominal:  ?   General: Bowel sounds are normal.  ?  Palpations: Abdomen is soft.  ?   Tenderness: There is no abdominal tenderness. There is no right CVA tenderness or left CVA tenderness.  ?Musculoskeletal:     ?   General: No swelling.  ?   Cervical back: Normal range of motion and neck supple.  ?Lymphadenopathy:  ?   Cervical: No cervical adenopathy.  ?Skin: ?   General: Skin is warm and dry.  ?   Capillary Refill: Capillary refill takes less than 2 seconds.  ?   Findings: No rash.  ?   Comments: Skin turgor normal.  ?Neurological:  ?   General: No focal deficit present.  ?   Mental Status: She is alert and oriented to person, place, and time. Mental status is at baseline.  ?   Sensory: Sensation is intact. No sensory deficit.  ?   Motor: Motor function is intact. No weakness.  ?   Coordination: Coordination is intact. Coordination normal.  ?   Gait: Gait is intact.  ?Psychiatric:     ?   Mood and Affect: Mood normal.     ?   Speech: Speech normal.     ?   Behavior: Behavior normal. Behavior is cooperative.     ?   Thought Content: Thought content normal.     ?   Judgment: Judgment normal.  ? ? ? ?UC Treatments / Results  ?Labs ?(all labs ordered are listed, but only abnormal results are displayed) ?Labs Reviewed  ?SARS CORONAVIRUS 2 (TAT 6-24 HRS)  ?CULTURE, GROUP A STREP [facility])  ?POCT RAPID STREP A, ED / UC  ?POC INFLUENZA A AND B ANTIGEN (URGENT CARE ONLY)  ? ? ?EKG ? ? ?Radiology ?No results found. ? ?Procedures ?Procedures (including critical care time) ? ?Medications Ordered in UC ?Medications - No data to display ? ?Initial Impression / Assessment and Plan / UC Course  ?I have reviewed the triage vital signs and the nursing notes. ? ?Pertinent labs & imaging results that were available during my care of the patient were reviewed by me and considered in my medical decision making (see chart for details). ? ?Patient's symptoms are  likely related to an upper respiratory virus due to exposure at work and when she was visiting her father at [employer].  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  [place]

## 2021-12-23 NOTE — ED Triage Notes (Signed)
Pt c/o sore throat, fever and headache. States symptoms began Sunday. Last took Tylenol about 4 am.  ?

## 2021-12-24 ENCOUNTER — Encounter (HOSPITAL_COMMUNITY): Payer: Self-pay

## 2021-12-25 LAB — CULTURE, GROUP A STREP (THRC)

## 2022-01-30 ENCOUNTER — Ambulatory Visit (HOSPITAL_COMMUNITY)
Admission: EM | Admit: 2022-01-30 | Discharge: 2022-01-30 | Disposition: A | Discharge status: Home / Self Care | Payer: 59 | Attending: Emergency Medicine | Admitting: Emergency Medicine

## 2022-01-30 ENCOUNTER — Encounter (HOSPITAL_COMMUNITY): Payer: Self-pay | Admitting: Emergency Medicine

## 2022-01-30 DIAGNOSIS — M549 Dorsalgia, unspecified: Secondary | ICD-10-CM

## 2022-01-30 MED ORDER — METHOCARBAMOL 500 MG PO TABS: 500.0000 mg | ORAL_TABLET | Freq: Two times a day (BID) | ORAL | 0 refills | Status: DC

## 2022-01-30 MED ORDER — LIDOCAINE 5 % EX PTCH: 1.0000 | MEDICATED_PATCH | CUTANEOUS | 0 refills | Status: AC

## 2022-01-30 MED ORDER — ACETAMINOPHEN 500 MG PO TABS: 500.0000 mg | ORAL_TABLET | Freq: Four times a day (QID) | ORAL | 0 refills | Status: DC | PRN

## 2022-01-30 NOTE — ED Provider Notes (Signed)
[facility]    CSN: [id number] Arrival date & time: 01/30/22  1724     History   Chief Complaint Chief Complaint  Patient presents with  . Back Pain  . Arm Pain    HPI Sara Arias is a 56 y.o. female.  Presents with 1 week of left back pain and left shoulder pain.  She lifted a heavy case of powerade and felt she pulled a muscle. 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 Believes this has made her symptoms worse.  She has tried muscle relaxer a few times at night that helps her sleep. Tried one tylenol this morning but no other medications. Feels like she pulled a muscle. Hx of left shoulder surgery for fracture repair in 2017.  Denies fever, chills, headache, dizziness, chest pain, shortness of breath, abdominal pain, vomiting/diarrhea, urinary symptoms, bladder or bowel incontinence, numbness/tingling of the extremities, weakness.   Past Medical History:  Diagnosis Date  . Knee pain   . Obesity   . Panic attacks     There are no problems to display for this patient.   Past Surgical History:  Procedure Laterality Date  . PARTIAL HYSTERECTOMY    . SHOULDER SURGERY      OB History   No obstetric history on file.      Home Medications    Prior to Admission medications   Medication Sig Start Date End Date Taking? Authorizing Provider  lidocaine (LIDODERM) 5 % Place 1 patch onto the skin daily for 7 days. Apply one patch the the back for 12 hours. Remove for 12 hours. You can then use another patch 01/30/22 02/06/22 Yes Mica Releford, Lurena Joiner, PA-C  acetaminophen (TYLENOL) 500 MG tablet Take 1 tablet (500 mg total) by mouth every 6 (six) hours as needed. 01/30/22   Rivers Gassmann, Lurena Joiner, PA-C  albuterol (VENTOLIN HFA) 108 (90 Base) MCG/ACT inhaler Inhale 1-2 puffs into the lungs every 4 (four) hours as needed for wheezing or shortness of breath. 11/03/21   Zenia Resides, MD  aspirin 325 MG tablet Take 650 mg by mouth daily as needed for mild  pain or headache.    [provider]  Biotin w/ Vitamins C & E (HAIR SKIN & NAILS GUMMIES) 1250-7.5-7.5 MCG-MG-UNT CHEW Chew 2 each by mouth daily.    [provider]  Camphor-Menthol-Methyl Sal (SALONPAS EX) Apply topically.    [provider]  fluticasone (FLONASE) 50 MCG/ACT nasal spray Place 1 spray into both nostrils daily. 12/23/21   Carlisle Beers, FNP  Guaifenesin 1200 MG TB12 Take 1 tablet (1,200 mg total) by mouth in the morning and at bedtime. 12/23/21   Carlisle Beers, FNP  methocarbamol (ROBAXIN) 500 MG tablet Take 1 tablet (500 mg total) by mouth in the morning and at bedtime. 01/30/22   Hendy Brindle, Lurena Joiner, PA-C  sodium-potassium bicarbonate (ALKA-SELTZER GOLD) TBEF dissolvable tablet Take 1 tablet by mouth daily as needed (congestion).    [provider]    Family History Family History  Problem Relation Age of Onset  . Osteoarthritis Mother   . Hypertension Father   . Hypertension Sister     Social History Social History   Tobacco Use  . Smoking status: Former    Types: Cigarettes  . Smokeless tobacco: Never  Vaping Use  . Vaping Use: Never used  Substance Use Topics  . Alcohol use: No  . Drug use: No     Allergies  Ibuprofen   Review of Systems Review of Systems  Musculoskeletal:  Positive for back pain.   As per HPI  Physical Exam Triage Vital Signs ED Triage Vitals  Enc Vitals Group     BP 01/30/22 1743 (!) 163/92     Pulse Rate 01/30/22 1743 77     Resp 01/30/22 1743 18     Temp 01/30/22 1743 98.2 F (36.8 C)     Temp Source 01/30/22 1743 Oral     SpO2 01/30/22 1743 95 %     Weight --      Height --      Head Circumference --      Peak Flow --      Pain Score 01/30/22 1742 10     Pain Loc --      Pain Edu? --      Excl. in GC? --    No data found.  Updated Vital Signs BP (!) 163/92   Pulse 77   Temp 98.2 F (36.8 C) (Oral)   Resp 18   SpO2 95%    Physical Exam Vitals and nursing  note reviewed.  Constitutional:      General: She is not in acute distress. HENT:     Nose: Nose normal.     Mouth/Throat:     Mouth: Mucous membranes are moist.     Pharynx: Oropharynx is clear.  Eyes:     Extraocular Movements: Extraocular movements intact.     Pupils: Pupils are equal, round, and reactive to light.  Cardiovascular:     Rate and Rhythm: Normal rate and regular rhythm.     Heart sounds: Normal heart sounds.  Pulmonary:     Effort: Pulmonary effort is normal.     Breath sounds: Normal breath sounds.  Abdominal:     Palpations: Abdomen is soft.     Tenderness: There is no abdominal tenderness.  Musculoskeletal:     Left shoulder: No swelling, deformity, tenderness or bony tenderness. Decreased range of motion. Normal strength. Normal pulse.     Left wrist: Normal.     Cervical back: No bony tenderness.     Thoracic back: No bony tenderness.     Lumbar back: No bony tenderness. Negative right straight leg raise test and negative left straight leg raise test.     Comments: Pain with overhead flexion of the shoulder, abduction. No obvious deformity, no bony tenderness. Pulses and sensation intact. Cervical paraspinal muscles TTP on left  Neurological:     General: No focal deficit present.     Mental Status: She is alert and oriented to person, place, and time.     Cranial Nerves: Cranial nerves 2-12 are intact. No facial asymmetry.     Sensory: Sensation is intact.     Motor: Motor function is intact. No weakness.     Coordination: Coordination is intact.     Gait: Gait is intact.     Deep Tendon Reflexes: Reflexes are normal and symmetric.     Comments: Strength 5/5 all extremities    UC Treatments / Results  Labs (all labs ordered are listed, but only abnormal results are displayed) Labs Reviewed - No data to display  EKG  Radiology No results found.  Procedures Procedures (including critical care time)  Medications Ordered in UC Medications - No  data to display  Initial Impression / Assessment and Plan / UC Course  I have reviewed the triage vital signs and the nursing notes.  Pertinent  labs & imaging results that were available during my care of the patient were reviewed by me and considered in my medical decision making (see chart for details).  Pain appears to be muscular in nature. Physical exam unremarkable apart from pain reproduced with overhead flexion of left shoulder. Otherwise full ROM, vascularly intact. Patient has been using left arm to lift repeatedly after initial injury. I recommend she take some time off from work to rest the arm and back. Work note provided. We discussed using tylenol consistently every 6 hours for pain. She can try muscle relaxer twice daily, understands it may make her drowsy and to then take only at night. Also will try lidocaine patches. Patient is instructed to follow up with her primary care if symptoms do not improve. She can also go to walk in orthopedic clinic hours. Patient agrees to plan, is discharged in stable condition with strict return precautions.   Final Clinical Impressions(s) / UC Diagnoses   Final diagnoses:  Other acute back pain     Discharge Instructions      Please take medication as prescribed. The Robaxin may make you drowsy. If this occurs, take only at night time. Take tylenol every 6 hours. You can use the lidocaine patches for 12 hours at a time.     ED Prescriptions     Medication Sig Dispense Auth. Provider   methocarbamol (ROBAXIN) 500 MG tablet Take 1 tablet (500 mg total) by mouth in the morning and at bedtime. 10 tablet Josiah Nieto, PA-C   acetaminophen (TYLENOL) 500 MG tablet Take 1 tablet (500 mg total) by mouth every 6 (six) hours as needed. 30 tablet Symphany Fleissner, PA-C   lidocaine (LIDODERM) 5 % Place 1 patch onto the skin daily for 7 days. Apply one patch the the back for 12 hours. Remove for 12 hours. You can then use another patch 7 patch  Eddith Mentor, Lurena Joinerebecca, PA-C      PDMP not reviewed this encounter.

## 2022-01-30 NOTE — ED Triage Notes (Signed)
Pt is present today with back pain and left shoulder pain. Pt states that she picked up a case powerade and feels that she may have pulled a muscle.Pt states that it happened last saturday

## 2022-01-30 NOTE — Discharge Instructions (Signed)
Please take medication as prescribed. The Robaxin may make you drowsy. If this occurs, take only at night time. Take tylenol every 6 hours. You can use the lidocaine patches for 12 hours at a time.

## 2022-02-11 ENCOUNTER — Ambulatory Visit: Payer: 59 | Admitting: Sports Medicine

## 2022-03-10 ENCOUNTER — Encounter (HOSPITAL_COMMUNITY): Payer: Self-pay

## 2022-03-10 ENCOUNTER — Ambulatory Visit (HOSPITAL_COMMUNITY)
Admission: RE | Admit: 2022-03-10 | Discharge: 2022-03-10 | Disposition: A | Discharge status: Home / Self Care | Payer: 59 | Source: Ambulatory Visit | Attending: Student | Admitting: Student

## 2022-03-10 VITALS — BP 169/108 | HR 102 | Temp 97.3°F | Resp 18

## 2022-03-10 DIAGNOSIS — L03221 Cellulitis of neck: Secondary | ICD-10-CM | POA: Diagnosis not present

## 2022-03-10 MED ORDER — CEPHALEXIN 500 MG PO CAPS: 500.0000 mg | ORAL_CAPSULE | Freq: Four times a day (QID) | ORAL | 0 refills | Status: DC

## 2022-03-10 NOTE — ED Triage Notes (Signed)
Onset 2-3 days ago, pt reports sore throat and a small knot under her throat. She reports the knot is red and hard to touch. Pt reports using OTC benadryl.

## 2022-03-10 NOTE — Discharge Instructions (Signed)
-  Start the antibiotic: Keflex, 4x daily x5 days. You can take this with food if you have a sensitive stomach. -Wash wound with gentle soap and water only -Follow-up immediately if symptoms worsen instead of improve - pain, swelling, drainage, new fevers, shortness of breath

## 2022-03-10 NOTE — ED Provider Notes (Signed)
[facility]    CSN: [id number] Arrival date & time: 03/10/22  0858      History   Chief Complaint Chief Complaint  Patient presents with   Insect Bite    HPI SIONA COULSTON is a 56 y.o. female presenting with area of swelling below her chin. History noncontributory. States she recently had a mild sore throat and cough, which has resolved over the last few days. However two days ago noticed an area of swelling below her chin.  She does not recall being stung by an insect, but states she is concerned that she might have had a bee sting due to the swelling.  She has been applying alcohol to it, and it has actually improved in size.  Denies drainage from the area.  Denies current sore throat, shortness of breath, sensation of throat closing, hoarse voice, voice changes, shortness of breath, dizziness, chest pain, nausea, vomiting, diarrhea.  HPI  Past Medical History:  Diagnosis Date   Knee pain    Obesity    Panic attacks     There are no problems to display for this patient.   Past Surgical History:  Procedure Laterality Date   PARTIAL HYSTERECTOMY     SHOULDER SURGERY      OB History   No obstetric history on file.      Home Medications    Prior to Admission medications   Medication Sig Start Date End Date Taking? Authorizing Provider  cephALEXin (KEFLEX) 500 MG capsule Take 1 capsule (500 mg total) by mouth 4 (four) times daily. 03/10/22  Yes Rhys Martini, PA-C  acetaminophen (TYLENOL) 500 MG tablet Take 1 tablet (500 mg total) by mouth every 6 (six) hours as needed. 01/30/22   Rising, Lurena Joiner, PA-C  albuterol (VENTOLIN HFA) 108 (90 Base) MCG/ACT inhaler Inhale 1-2 puffs into the lungs every 4 (four) hours as needed for wheezing or shortness of breath. 11/03/21   Zenia Resides, MD  aspirin 325 MG tablet Take 650 mg by mouth daily as needed for mild pain or headache.    [provider]  Biotin w/ Vitamins C & E (HAIR SKIN & NAILS GUMMIES)  1250-7.5-7.5 MCG-MG-UNT CHEW Chew 2 each by mouth daily.    [provider]  Camphor-Menthol-Methyl Sal (SALONPAS EX) Apply topically.    [provider]  fluticasone (FLONASE) 50 MCG/ACT nasal spray Place 1 spray into both nostrils daily. 12/23/21   Carlisle Beers, FNP  Guaifenesin 1200 MG TB12 Take 1 tablet (1,200 mg total) by mouth in the morning and at bedtime. 12/23/21   Carlisle Beers, FNP  methocarbamol (ROBAXIN) 500 MG tablet Take 1 tablet (500 mg total) by mouth in the morning and at bedtime. 01/30/22   Rising, Lurena Joiner, PA-C  sodium-potassium bicarbonate (ALKA-SELTZER GOLD) TBEF dissolvable tablet Take 1 tablet by mouth daily as needed (congestion).    [provider]    Family History Family History  Problem Relation Age of Onset   Osteoarthritis Mother    Hypertension Father    Hypertension Sister     Social History Social History   Tobacco Use   Smoking status: Former    Types: Cigarettes   Smokeless tobacco: Never  Vaping Use   Vaping Use: Never used  Substance Use Topics   Alcohol use: No   Drug use: No     Allergies   Ibuprofen   Review of Systems Review of Systems  Constitutional:  Negative for appetite change, chills,  diaphoresis, fever and unexpected weight change.  HENT:  Negative for congestion, ear pain, sinus pressure, sinus pain, sneezing, sore throat and trouble swallowing.   Respiratory:  Negative for cough, chest tightness and shortness of breath.   Cardiovascular:  Negative for chest pain.  Gastrointestinal:  Negative for abdominal distention, abdominal pain, anal bleeding, blood in stool, constipation, diarrhea, nausea, rectal pain and vomiting.  Genitourinary:  Negative for dysuria, flank pain, frequency and urgency.  Musculoskeletal:  Negative for back pain and myalgias.  Skin:  Positive for wound.  Neurological:  Negative for dizziness, light-headedness and headaches.  All other systems reviewed and are  negative.    Physical Exam Triage Vital Signs ED Triage Vitals  Enc Vitals Group     BP 03/10/22 0937 (!) 169/108     Pulse Rate 03/10/22 0941 (!) 102     Resp 03/10/22 0941 18     Temp 03/10/22 0937 (!) 97.3 F (36.3 C)     Temp Source 03/10/22 0937 Oral     SpO2 03/10/22 0941 98 %     Weight --      Height --      Head Circumference --      Peak Flow --      Pain Score --      Pain Loc --      Pain Edu? --      Excl. in GC? --    No data found.  Updated Vital Signs BP (!) 169/108   Pulse (!) 102   Temp (!) 97.3 F (36.3 C) (Oral)   Resp 18   SpO2 98%   Visual Acuity Right Eye Distance:   Left Eye Distance:   Bilateral Distance:    Right Eye Near:   Left Eye Near:    Bilateral Near:     Physical Exam Vitals reviewed.  Constitutional:      Appearance: Normal appearance. She is not ill-appearing.  HENT:     Head: Normocephalic and atraumatic.     Right Ear: Hearing, tympanic membrane, ear canal and external ear normal. No swelling or tenderness. No middle ear effusion. There is no impacted cerumen. No mastoid tenderness. Tympanic membrane is not injected, scarred, perforated, erythematous, retracted or bulging.     Left Ear: Hearing, tympanic membrane, ear canal and external ear normal. No swelling or tenderness.  No middle ear effusion. There is no impacted cerumen. No mastoid tenderness. Tympanic membrane is not injected, scarred, perforated, erythematous, retracted or bulging.     Mouth/Throat:     Pharynx: Oropharynx is clear. No oropharyngeal exudate or posterior oropharyngeal erythema.     Comments: No facial, lip, tongue, uvula swelling. Airway patent. No pharyngeal swelling or voice changes. Cardiovascular:     Rate and Rhythm: Normal rate and regular rhythm.     Heart sounds: Normal heart sounds.  Pulmonary:     Effort: Pulmonary effort is normal.     Breath sounds: Normal breath sounds.  Lymphadenopathy:     Cervical: No cervical adenopathy.   Skin:    Comments: See image below Well circumscribed area of cellulitis anterior neck, measuring approx 1.5cm x 1.5cm. induration but no fluctuance..  Neurological:     General: No focal deficit present.     Mental Status: She is alert and oriented to person, place, and time.  Psychiatric:        Mood and Affect: Mood normal.        Behavior: Behavior normal.  Thought Content: Thought content normal.        Judgment: Judgment normal.        UC Treatments / Results  Labs (all labs ordered are listed, but only abnormal results are displayed) Labs Reviewed - No data to display  EKG   Radiology No results found.  Procedures Procedures (including critical care time)  Medications Ordered in UC Medications - No data to display  Initial Impression / Assessment and Plan / UC Course  I have reviewed the triage vital signs and the nursing notes.  Pertinent labs & imaging results that were available during my care of the patient were reviewed by me and considered in my medical decision making (see chart for details).     This patient is a very pleasant 56 y.o. year old female presenting with cellulitis anterior neck. Afebrile, nontachy. Airway patent. The area is well circumscribed, and there is no pharyngeal swelling. Will start her on Keflex. Stop applying alcohol to the wound. ED return precautions discussed. Patient verbalizes understanding and agreement.   Final Clinical Impressions(s) / UC Diagnoses   Final diagnoses:  Cellulitis of neck     Discharge Instructions      -Start the antibiotic: Keflex, 4x daily x5 days. You can take this with food if you have a sensitive stomach. -Wash wound with gentle soap and water only -Follow-up immediately if symptoms worsen instead of improve - pain, swelling, drainage, new fevers, shortness of breath    ED Prescriptions     Medication Sig Dispense Auth. Provider   cephALEXin (KEFLEX) 500 MG capsule Take 1 capsule  (500 mg total) by mouth 4 (four) times daily. 20 capsule Rhys Martini, PA-C      PDMP not reviewed this encounter.   Rhys Martini, PA-C 03/10/22 1018

## 2022-03-17 ENCOUNTER — Ambulatory Visit (HOSPITAL_COMMUNITY): Payer: 59

## 2022-03-24 ENCOUNTER — Ambulatory Visit (HOSPITAL_COMMUNITY)
Admission: EM | Admit: 2022-03-24 | Discharge: 2022-03-24 | Disposition: A | Discharge status: Home / Self Care | Payer: 59 | Attending: Family Medicine | Admitting: Family Medicine

## 2022-03-24 ENCOUNTER — Encounter (HOSPITAL_COMMUNITY): Payer: Self-pay

## 2022-03-24 ENCOUNTER — Ambulatory Visit (HOSPITAL_COMMUNITY): Payer: 59

## 2022-03-24 DIAGNOSIS — L720 Epidermal cyst: Secondary | ICD-10-CM | POA: Diagnosis not present

## 2022-03-24 MED ORDER — DOXYCYCLINE MONOHYDRATE 100 MG PO CAPS: 100.0000 mg | ORAL_CAPSULE | Freq: Two times a day (BID) | ORAL | 0 refills | Status: DC

## 2022-03-24 MED ORDER — LIDOCAINE-EPINEPHRINE 1 %-1:100000 IJ SOLN
INTRAMUSCULAR | Status: AC
  Filled 2022-03-24: qty 1

## 2022-03-24 NOTE — ED Triage Notes (Signed)
Pt states stung by a bee under her chin a week ago and was seen and tx'd here. States abscess is better but won't drain.

## 2022-03-25 NOTE — ED Provider Notes (Addendum)
[facility]   [id number] 03/24/22 Arrival Time: 1056  ASSESSMENT & PLAN:  1. Epidermoid cyst of neck    She declines I&D attempt for comfort. Wishes to proceed with attempted aspiration.  Aspiration Procedure Note  Anesthesia: 1% lidocaine with epinephrine  Procedure Details  The procedure, risks and complications have been discussed in detail (including, but not limited to pain and bleeding) with the patient.  The skin induration was prepped and draped in the usual fashion. After adequate local anesthesia, aspiration of likely cyst attempted with a #18g needle was performed without significant results. Still declines I&D.  EBL: minimal Drains: none Packing: n/a Condition: Tolerated procedure well Complications: none.  Begin: Meds ordered this encounter  Medications   doxycycline (MONODOX) 100 MG capsule    Sig: Take 1 capsule (100 mg total) by mouth 2 (two) times daily.    Dispense:  14 capsule    Refill:  0    Follow-up Information     Schedule an appointment as soon as possible for a visit  with [facility].   Contact information: [address] [place] [address] [phone]                 Reviewed expectations re: course of current medical issues. Questions answered. Outlined signs and symptoms indicating need for more acute intervention. Patient verbalized understanding. After Visit Summary given.   SUBJECTIVE:  Sara Arias is a 56 y.o. female who presents with a possible infection of her chin; seen recently; placed on Keflex; not much help; finised. "Hard area" under chin. No drainage or bleeding. Is tender.   OBJECTIVE:  Vitals:   03/24/22 1253  BP: (!) 151/88  Pulse: 94  Resp: 18  Temp: 97.9 F (36.6 C)  TempSrc: Oral  SpO2: 99%     General appearance: alert; no distress HEENT: approx 1.5 cm firm induration of her chin at anterior neck; tender to touch; no active  drainage or bleeding; minimal overlying erythema; no cervical LAD Psychological: alert and cooperative; normal mood and affect  Allergies  Allergen Reactions   Ibuprofen Swelling    Swelling of the face, per patient naproxyn is ok    Past Medical History:  Diagnosis Date   Knee pain    Obesity    Panic attacks    Social History   Socioeconomic History   Marital status: Divorced    Spouse name: Not on file   Number of children: Not on file   Years of education: Not on file   Highest education level: Not on file  Occupational History   Not on file  Tobacco Use   Smoking status: Former    Types: Cigarettes   Smokeless tobacco: Never  Vaping Use   Vaping Use: Never used  Substance and Sexual Activity   Alcohol use: No   Drug use: No   Sexual activity: Never    Birth control/protection: Surgical  Other Topics Concern   Not on file  Social History Narrative   Not on file   Social Determinants of Health   Financial Resource Strain: Not on file  Food Insecurity: Not on file  Transportation Needs: Not on file  Physical Activity: Not on file  Stress: Not on file  Social Connections: Not on file   Family History  Problem Relation Age of Onset   Osteoarthritis Mother    Hypertension Father    Hypertension Sister    Past Surgical History:  Procedure Laterality Date   PARTIAL HYSTERECTOMY     SHOULDER SURGERY              Mardella Layman, MD 03/25/22 2130    Mardella Layman, MD 03/25/22 [phone]

## 2022-06-25 ENCOUNTER — Encounter (HOSPITAL_COMMUNITY): Payer: Self-pay

## 2022-06-25 ENCOUNTER — Ambulatory Visit (HOSPITAL_COMMUNITY)
Admission: RE | Admit: 2022-06-25 | Discharge: 2022-06-25 | Disposition: A | Discharge status: Home / Self Care | Payer: No Typology Code available for payment source | Source: Ambulatory Visit | Attending: Sports Medicine | Admitting: Sports Medicine

## 2022-06-25 ENCOUNTER — Ambulatory Visit (INDEPENDENT_AMBULATORY_CARE_PROVIDER_SITE_OTHER): Payer: No Typology Code available for payment source

## 2022-06-25 VITALS — BP 176/113 | HR 62 | Temp 98.3°F | Resp 12

## 2022-06-25 DIAGNOSIS — M25561 Pain in right knee: Secondary | ICD-10-CM | POA: Diagnosis not present

## 2022-06-25 DIAGNOSIS — M1711 Unilateral primary osteoarthritis, right knee: Secondary | ICD-10-CM | POA: Diagnosis not present

## 2022-06-25 MED ORDER — BUPIVACAINE HCL (PF) 0.5 % IJ SOLN
INTRAMUSCULAR | Status: AC
Start: 2022-06-25 — End: ?
  Filled 2022-06-25: qty 10

## 2022-06-25 MED ORDER — LIDOCAINE HCL (PF) 1 % IJ SOLN
2.0000 mL | Freq: Once | INTRAMUSCULAR | Status: AC
  Administered 2022-06-25: 2 mL

## 2022-06-25 MED ORDER — TRIAMCINOLONE ACETONIDE 40 MG/ML IJ SUSP
INTRAMUSCULAR | Status: AC
  Filled 2022-06-25: qty 1

## 2022-06-25 MED ORDER — BUPIVACAINE HCL 0.25 % IJ SOLN
2.0000 mL | Freq: Once | INTRAMUSCULAR | Status: AC
  Administered 2022-06-25: 2 mL via INTRA_ARTICULAR

## 2022-06-25 MED ORDER — LIDOCAINE HCL (PF) 1 % IJ SOLN
INTRAMUSCULAR | Status: AC
  Filled 2022-06-25: qty 60

## 2022-06-25 MED ORDER — TRIAMCINOLONE ACETONIDE 40 MG/ML IJ SUSP
40.0000 mg | Freq: Once | INTRAMUSCULAR | Status: AC
  Administered 2022-06-25: 40 mg via INTRA_ARTICULAR

## 2022-06-25 NOTE — Discharge Instructions (Addendum)
You have an acute on chronic exacerbation of your osteoarthritis in your right knee.  Your x-rays today did not show any bony fracture but did show advanced osteoarthritis in the inside compartment of your knee.  You had a steroid injection today with numbing medicine as well anterior knee.  I recommend close follow-up with sports medicine or orthopedics.

## 2022-06-25 NOTE — ED Triage Notes (Signed)
Pt injured right knee x1wk causing pain and discompfort when walking

## 2022-06-25 NOTE — ED Provider Notes (Signed)
[place]    CSN: [id number] Arrival date & time: 06/25/22  1112      History   Chief Complaint Chief Complaint  Patient presents with   Knee Pain    HPI Sara Arias is a 56 y.o. female.   She presents today with chief complaint of right knee pain for about 1 week.  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  This has been persistent for the past week, limiting her from being able to walk to her second job or ambulate without limp.  She has been using compression sleeve and IcyHot with minimal relief of her pain.  She reports she has noticed some swelling.  She had a similar episode to this a couple of months ago which improved with a steroid injection into her knee.  She does not follow with orthopedic surgery she receives injection at urgent care so she presented again.  She is unable to tolerate ibuprofen as she has elevated blood pressure.  She denies any buckling or instability.   Knee Pain   Past Medical History:  Diagnosis Date   Knee pain    Obesity    Panic attacks     There are no problems to display for this patient.   Past Surgical History:  Procedure Laterality Date   PARTIAL HYSTERECTOMY     SHOULDER SURGERY      OB History   No obstetric history on file.      Home Medications    Prior to Admission medications   Medication Sig Start Date End Date Taking? Authorizing Provider  acetaminophen (TYLENOL) 500 MG tablet Take 1 tablet (500 mg total) by mouth every 6 (six) hours as needed. 01/30/22   Rising, Wells Guiles, PA-C  albuterol (VENTOLIN HFA) 108 (90 Base) MCG/ACT inhaler Inhale 1-2 puffs into the lungs every 4 (four) hours as needed for wheezing or shortness of breath. 11/03/21   Barrett Henle, MD  aspirin 325 MG tablet Take 650 mg by mouth daily as needed for mild pain or headache.    [provider]  Biotin w/ Vitamins C & E (HAIR SKIN & NAILS GUMMIES)  1250-7.5-7.5 MCG-MG-UNT CHEW Chew 2 each by mouth daily.    [provider]  Camphor-Menthol-Methyl Sal (SALONPAS EX) Apply topically.    [provider]  doxycycline (MONODOX) 100 MG capsule Take 1 capsule (100 mg total) by mouth 2 (two) times daily. 03/24/22   Vanessa Kick, MD  fluticasone (FLONASE) 50 MCG/ACT nasal spray Place 1 spray into both nostrils daily. 12/23/21   Talbot Grumbling, FNP  Guaifenesin 1200 MG TB12 Take 1 tablet (1,200 mg total) by mouth in the morning and at bedtime. 12/23/21   Talbot Grumbling, FNP  methocarbamol (ROBAXIN) 500 MG tablet Take 1 tablet (500 mg total) by mouth in the morning and at bedtime. 01/30/22   Rising, Wells Guiles, PA-C  sodium-potassium bicarbonate (ALKA-SELTZER GOLD) TBEF dissolvable tablet Take 1 tablet by mouth daily as needed (congestion).    [provider]    Family History Family History  Problem Relation Age of Onset   Osteoarthritis Mother    Hypertension Father    Hypertension Sister     Social History Social History   Tobacco Use   Smoking status: Former    Types: Cigarettes   Smokeless tobacco: Never  Vaping Use   Vaping Use: Never used  Substance Use Topics  Alcohol use: No   Drug use: No     Allergies   Ibuprofen   Review of Systems Review of Systems As listed above in HPI  Physical Exam Triage Vital Signs ED Triage Vitals  Enc Vitals Group     BP 06/25/22 1217 (!) 176/113     Pulse Rate 06/25/22 1217 62     Resp 06/25/22 1217 12     Temp 06/25/22 1217 98.3 F (36.8 C)     Temp Source 06/25/22 1217 Oral     SpO2 06/25/22 1217 99 %     Weight --      Height --      Head Circumference --      Peak Flow --      Pain Score 06/25/22 1215 10     Pain Loc --      Pain Edu? --      Excl. in [place]? --    No data found.  Updated Vital Signs BP (!) 176/113 (BP Location: Left Arm)   Pulse 62   Temp 98.3 F (36.8 C) (Oral)   Resp 12   SpO2 99%   Physical Exam Vitals  reviewed.  Constitutional:      General: She is not in acute distress.    Appearance: Normal appearance. She is obese. She is not ill-appearing, toxic-appearing or diaphoretic.  Pulmonary:     Effort: Pulmonary effort is normal.  Skin:    General: Skin is warm.  Neurological:     Mental Status: She is alert.   Right knee: No obvious deformity or asymmetry.  No ecchymosis or edema.  She does have some tenderness to palpation of her medial joint line.  Decreased range of motion with flexion secondary to pain.  Limited exam secondary to pain.  Antalgic gait   UC Treatments / Results  Labs (all labs ordered are listed, but only abnormal results are displayed) Labs Reviewed - No data to display  EKG   Radiology DG Knee Complete 4 Views Right  Result Date: 06/25/2022 CLINICAL DATA:  Knee pain EXAM: RIGHT KNEE - COMPLETE 4 VIEW COMPARISON:  Right knee radiograph dated Jan 10, 2011 FINDINGS: No evidence of fracture, dislocation, or joint effusion. Moderate to severe degenerative changes of the medial and patellofemoral compartments. Mild degenerative changes of the lateral compartment. Soft tissues are unremarkable. IMPRESSION: 1. No acute osseous abnormality. 2. Moderate to severe degenerative changes of the medial and patellofemoral compartments, worsened when compared with the prior. Electronically Signed   By: Yetta Glassman M.D.   On: 06/25/2022 12:57    Procedures Join Aspiration/Injection  Date/Time: 06/25/2022 1:23 PM  Performed by: Elmore Guise, DO Authorized by: Elmore Guise, DO   Consent:    Consent obtained:  Verbal   Consent given by:  Patient   Risks, benefits, and alternatives were discussed: yes     Risks discussed:  Bleeding, infection and pain   Alternatives discussed:  No treatment, delayed treatment, alternative treatment, referral and observation Location:    Location:  Knee   Knee:  R knee Procedure details:    Needle gauge:  22 G    Ultrasound guidance: no     Approach:  Lateral   Steroid injected: yes   Post-procedure details:    Dressing:  Adhesive bandage   Procedure completion:  Tolerated well, no immediate complications 1 cc 40 mg Kenalog, 2 cc lidocaine without epinephrine and 2 cc Marcaine intra-articular  Medications Ordered in UC Medications  bupivacaine (MARCAINE) 0.25 % (with pres) injection 2 mL (has no administration in time range)  lidocaine (PF) (XYLOCAINE) 1 % injection 2 mL (2 mLs Other Given 06/25/22 1341)  triamcinolone acetonide (KENALOG-40) injection 40 mg (40 mg Intra-articular Given 06/25/22 1342)    Initial Impression / Assessment and Plan / UC Course  I have reviewed the triage vital signs and the nursing notes.  Pertinent labs & imaging results that were available during my care of the patient were reviewed by me and considered in my medical decision making (see chart for details).     Patient is here today with right knee pain of 1 week, x-rays showed no acute osseous abnormality, moderate to severe degenerative changes of the medial patellofemoral compartments worse when compared with prior images.  I discussed with patient options such as oral medications, topicals injections and surgery.  She opted for an intra-articular steroid injection today.  Injection as detailed above.  She tolerated procedure well.  Discussed with patient she will need follow-up with sports medicine orthopedic surgery. Patient is currently looking for a primary care provider to manage her hypertension.  Recommend provided today Final Clinical Impressions(s) / UC Diagnoses   Final diagnoses:  Acute pain of right knee     Discharge Instructions      You have an acute on chronic exacerbation of your osteoarthritis in your right knee.  Your x-rays today did not show any bony fracture but did show advanced osteoarthritis in the inside compartment of your knee.  You had a steroid injection today with numbing  medicine as well anterior knee.  I recommend close follow-up with sports medicine or orthopedics.     ED Prescriptions   None    PDMP not reviewed this encounter.   Elmore Guise, DO 06/25/22 1343

## 2022-08-12 ENCOUNTER — Ambulatory Visit (HOSPITAL_COMMUNITY)
Admission: EM | Admit: 2022-08-12 | Discharge: 2022-08-12 | Disposition: A | Discharge status: Home / Self Care | Payer: No Typology Code available for payment source | Attending: Emergency Medicine | Admitting: Emergency Medicine

## 2022-08-12 ENCOUNTER — Encounter (HOSPITAL_COMMUNITY): Payer: Self-pay

## 2022-08-12 DIAGNOSIS — Z1152 Encounter for screening for COVID-19: Secondary | ICD-10-CM | POA: Diagnosis not present

## 2022-08-12 DIAGNOSIS — J069 Acute upper respiratory infection, unspecified: Secondary | ICD-10-CM | POA: Insufficient documentation

## 2022-08-12 LAB — POC INFLUENZA A AND B ANTIGEN (URGENT CARE ONLY)
INFLUENZA A ANTIGEN, POC: NEGATIVE
INFLUENZA B ANTIGEN, POC: NEGATIVE

## 2022-08-12 MED ORDER — AMOXICILLIN 500 MG PO CAPS: 500.0000 mg | ORAL_CAPSULE | Freq: Two times a day (BID) | ORAL | 0 refills | Status: AC

## 2022-08-12 NOTE — ED Provider Notes (Signed)
[facility]    CSN: [id number] Arrival date & time: 08/12/22  0856      History   Chief Complaint Chief Complaint  Patient presents with   Nasal Congestion   Cough    HPI Sara Arias is a 56 y.o. female.   Patient presents for evaluation of fever, chills, nasal congestion, rhinorrhea, sore throat, intermittent hoarseness, productive cough for 7 days.  Exposure to COVID at work, exposure to cigarette smoke.  Decreased appetite but tolerating fluids.  Has attempted use of NyQuil, salt water gargles, inhaler and nasal spray which have been somewhat helpful.  Denies shortness of breath or wheezing.  Denies respiratory history.  Non-smoker.  Past Medical History:  Diagnosis Date   Knee pain    Obesity    Panic attacks     There are no problems to display for this patient.   Past Surgical History:  Procedure Laterality Date   PARTIAL HYSTERECTOMY     SHOULDER SURGERY      OB History   No obstetric history on file.      Home Medications    Prior to Admission medications   Medication Sig Start Date End Date Taking? Authorizing Provider  acetaminophen (TYLENOL) 500 MG tablet Take 1 tablet (500 mg total) by mouth every 6 (six) hours as needed. 01/30/22  Yes Rising, Lurena Joiner, PA-C  albuterol (VENTOLIN HFA) 108 (90 Base) MCG/ACT inhaler Inhale 1-2 puffs into the lungs every 4 (four) hours as needed for wheezing or shortness of breath. 11/03/21  Yes Zenia Resides, MD  aspirin 325 MG tablet Take 650 mg by mouth daily as needed for mild pain or headache.   Yes [provider]  fluticasone (FLONASE) 50 MCG/ACT nasal spray Place 1 spray into both nostrils daily. 12/23/21  Yes Stanhope, Donavan Burnet, FNP  Biotin w/ Vitamins C & E (HAIR SKIN & NAILS GUMMIES) 1250-7.5-7.5 MCG-MG-UNT CHEW Chew 2 each by mouth daily.    [provider]  Camphor-Menthol-Methyl Sal (SALONPAS EX) Apply topically.    [provider]  doxycycline (MONODOX) 100 MG  capsule Take 1 capsule (100 mg total) by mouth 2 (two) times daily. 03/24/22   Mardella Layman, MD  Guaifenesin 1200 MG TB12 Take 1 tablet (1,200 mg total) by mouth in the morning and at bedtime. 12/23/21   Carlisle Beers, FNP  methocarbamol (ROBAXIN) 500 MG tablet Take 1 tablet (500 mg total) by mouth in the morning and at bedtime. 01/30/22   Rising, Lurena Joiner, PA-C  sodium-potassium bicarbonate (ALKA-SELTZER GOLD) TBEF dissolvable tablet Take 1 tablet by mouth daily as needed (congestion).    [provider]    Family History Family History  Problem Relation Age of Onset   Osteoarthritis Mother    Hypertension Father    Hypertension Sister     Social History Social History   Tobacco Use   Smoking status: Former    Types: Cigarettes   Smokeless tobacco: Never  Vaping Use   Vaping Use: Never used  Substance Use Topics   Alcohol use: No   Drug use: No     Allergies   Ibuprofen   Review of Systems Review of Systems  Constitutional:  Positive for chills and fever. Negative for activity change, appetite change, diaphoresis, fatigue and unexpected weight change.  HENT:  Positive for congestion, rhinorrhea, sore throat and voice change. Negative for dental problem, drooling, ear discharge, ear pain, facial swelling, hearing loss, mouth sores, nosebleeds, postnasal drip, sinus pressure, sinus  pain, sneezing, tinnitus and trouble swallowing.   Respiratory:  Positive for cough. Negative for apnea, choking, chest tightness, shortness of breath, wheezing and stridor.   Skin: Negative.      Physical Exam Triage Vital Signs ED Triage Vitals  Enc Vitals Group     BP 08/12/22 1155 (!) 182/99     Pulse Rate 08/12/22 1155 92     Resp 08/12/22 1155 16     Temp 08/12/22 1155 (!) 97.4 F (36.3 C)     Temp Source 08/12/22 1155 Oral     SpO2 08/12/22 1155 94 %     Weight --      Height --      Head Circumference --      Peak Flow --      Pain Score 08/12/22 1156 9     Pain  Loc --      Pain Edu? --      Excl. in [place]? --    No data found.  Updated Vital Signs BP (!) 182/99 (BP Location: Right Arm)   Pulse 92   Temp (!) 97.4 F (36.3 C) (Oral)   Resp 16   SpO2 94%   Visual Acuity Right Eye Distance:   Left Eye Distance:   Bilateral Distance:    Right Eye Near:   Left Eye Near:    Bilateral Near:     Physical Exam Constitutional:      Appearance: Normal appearance.  HENT:     Head: Normocephalic.     Right Ear: Tympanic membrane, ear canal and external ear normal.     Left Ear: Tympanic membrane, ear canal and external ear normal.     Nose: Congestion and rhinorrhea present.     Mouth/Throat:     Mouth: Mucous membranes are moist.     Pharynx: Oropharynx is clear.  Eyes:     Extraocular Movements: Extraocular movements intact.  Cardiovascular:     Rate and Rhythm: Normal rate and regular rhythm.     Pulses: Normal pulses.     Heart sounds: Normal heart sounds.  Pulmonary:     Effort: Pulmonary effort is normal.     Breath sounds: Normal breath sounds.  Skin:    General: Skin is warm and dry.  Neurological:     Mental Status: She is alert and oriented to person, place, and time. Mental status is at baseline.  Psychiatric:        Mood and Affect: Mood normal.        Behavior: Behavior normal.      UC Treatments / Results  Labs (all labs ordered are listed, but only abnormal results are displayed) Labs Reviewed - No data to display  EKG   Radiology No results found.  Procedures Procedures (including critical care time)  Medications Ordered in UC Medications - No data to display  Initial Impression / Assessment and Plan / UC Course  I have reviewed the triage vital signs and the nursing notes.  Pertinent labs & imaging results that were available during my care of the patient were reviewed by me and considered in my medical decision making (see chart for details).  Patient is in no signs of distress nor toxic  appearing.  Vital signs are stable.  Low suspicion for pneumonia, pneumothorax or bronchitis and therefore will defer imaging.  Flu test negative COVID test is pending, reviewed quarantine guidelines per CDC recommendations, as out of window for use of antivirals, discussed with patient  watchful wait antibiotic placed at pharmacy for 10 days of illness with no improvement, amoxicillin prescribed   May use additional over-the-counter medications as needed for supportive care.  May follow-up with urgent care as needed if symptoms persist or worsen.  Note given.   Final Clinical Impressions(s) / UC Diagnoses   Final diagnoses:  None   Discharge Instructions   None    ED Prescriptions   None    PDMP not reviewed this encounter.   Hans Eden, NP 08/12/22 1236

## 2022-08-12 NOTE — ED Triage Notes (Signed)
A week ago started having cough, chest congestion, body aches, chills, fever, chest pain from coughing. Recently been around people who are positive for covid. Taking nyquil OTC and tylenol.

## 2022-08-12 NOTE — Discharge Instructions (Signed)
Flu testing is negative, COVID test is pending, if positive you have already met quarantine guidelines as you have already been ill for 7 days  If your symptoms continue to persist with no signs of improvement, Sunday you may pick up antibiotic from the pharmacy to provide coverage for bacteria, take amoxicillin every morning every evening for 7 days  Use of inhaler and nasal spray if helpful, may also attempt any of the following below  Your symptoms today are most likely being caused by a virus and should steadily improve in time it can take up to 7 to 10 days before you truly start to see a turnaround however things will get better    You can take Tylenol and/or Ibuprofen as needed for fever reduction and pain relief.   For cough: honey 1/2 to 1 teaspoon (you can dilute the honey in water or another fluid).  You can also use guaifenesin and dextromethorphan for cough. You can use a humidifier for chest congestion and cough.  If you don't have a humidifier, you can sit in the bathroom with the hot shower running.      For sore throat: try warm salt water gargles, cepacol lozenges, throat spray, warm tea or water with lemon/honey, popsicles or ice, or OTC cold relief medicine for throat discomfort.   For congestion: take a daily anti-histamine like Zyrtec, Claritin, and a oral decongestant, such as pseudoephedrine.  You can also use Flonase 1-2 sprays in each nostril daily.   It is important to stay hydrated: drink plenty of fluids (water, gatorade/powerade/pedialyte, juices, or teas) to keep your throat moisturized and help further relieve irritation/discomfort.

## 2022-08-13 LAB — SARS CORONAVIRUS 2 (TAT 6-24 HRS): SARS Coronavirus 2: NEGATIVE

## 2022-09-07 ENCOUNTER — Ambulatory Visit (INDEPENDENT_AMBULATORY_CARE_PROVIDER_SITE_OTHER): Payer: Medicaid Other

## 2022-09-07 ENCOUNTER — Ambulatory Visit (HOSPITAL_COMMUNITY)
Admission: EM | Admit: 2022-09-07 | Discharge: 2022-09-07 | Disposition: A | Discharge status: Home / Self Care | Payer: Medicaid Other | Attending: Family Medicine | Admitting: Family Medicine

## 2022-09-07 ENCOUNTER — Encounter (HOSPITAL_COMMUNITY): Payer: Self-pay

## 2022-09-07 DIAGNOSIS — M62838 Other muscle spasm: Secondary | ICD-10-CM

## 2022-09-07 DIAGNOSIS — M19012 Primary osteoarthritis, left shoulder: Secondary | ICD-10-CM | POA: Diagnosis not present

## 2022-09-07 DIAGNOSIS — M25512 Pain in left shoulder: Secondary | ICD-10-CM

## 2022-09-07 MED ORDER — METHOCARBAMOL 500 MG PO TABS: 500.0000 mg | ORAL_TABLET | Freq: Three times a day (TID) | ORAL | 0 refills | Status: DC | PRN

## 2022-09-07 NOTE — Discharge Instructions (Signed)
Your x-ray showed worsening arthritis.  I am concerned that you have strained a muscle which has aggravated your arthritis.  Please continue your Tylenol as well as topical Voltaren.  Use methocarbamol up to 3 times a day.  This will make you sleepy so do not drive or drink alcohol while taking it.  I think you should establish/follow-up with a sports medicine provider.  Call them to schedule an appointment.  If you have any worsening symptoms including increased swelling, redness, increased pain, weakness you need to be seen immediately.

## 2022-09-07 NOTE — ED Provider Notes (Addendum)
[facility]    CSN: [id number] Arrival date & time: 09/07/22  1218      History   Chief Complaint Chief Complaint  Patient presents with   Back Pain   Shoulder Pain    HPI Sara Arias is a 57 y.o. female.   Patient presents today with a 2-day history of left shoulder and neck/back pain.  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  She is left-handed and reports weakness as result of the pain but denies any numbness or paresthesias.  She has had shoulder surgery on the left in the past but is not currently followed by orthopedics.  She reports that pain is rated 10 on a 0-10 pain scale, localized throughout her neck/back/arm, described as aching, no aggravating or alleviating factors identified.  She has tried topical Voltaren as well as Tylenol without improvement of symptoms.  She is also done Epsom salt baths.  She denies any risk factors for VTE event including recent COVID infection, hospitalization, surgery, immobilization, exogenous hormone use, active malignancy.  Denies personal or family history of VTE event in the past.    Past Medical History:  Diagnosis Date   Knee pain    Obesity    Panic attacks     There are no problems to display for this patient.   Past Surgical History:  Procedure Laterality Date   PARTIAL HYSTERECTOMY     SHOULDER SURGERY      OB History   No obstetric history on file.      Home Medications    Prior to Admission medications   Medication Sig Start Date End Date Taking? Authorizing Provider  acetaminophen (TYLENOL) 500 MG tablet Take 1 tablet (500 mg total) by mouth every 6 (six) hours as needed. 01/30/22   Rising, Wells Guiles, PA-C  albuterol (VENTOLIN HFA) 108 (90 Base) MCG/ACT inhaler Inhale 1-2 puffs into the lungs every 4 (four) hours as needed for wheezing or shortness of breath. 11/03/21   Barrett Henle, MD  aspirin 325 MG  tablet Take 650 mg by mouth daily as needed for mild pain or headache.    [provider]  Biotin w/ Vitamins C & E (HAIR SKIN & NAILS GUMMIES) 1250-7.5-7.5 MCG-MG-UNT CHEW Chew 2 each by mouth daily.    [provider]  Camphor-Menthol-Methyl Sal (SALONPAS EX) Apply topically.    [provider]  fluticasone (FLONASE) 50 MCG/ACT nasal spray Place 1 spray into both nostrils daily. 12/23/21   Talbot Grumbling, FNP  methocarbamol (ROBAXIN) 500 MG tablet Take 1 tablet (500 mg total) by mouth every 8 (eight) hours as needed for muscle spasms. 09/07/22   Rylann Munford, Derry Skill, PA-C  sodium-potassium bicarbonate (ALKA-SELTZER GOLD) TBEF dissolvable tablet Take 1 tablet by mouth daily as needed (congestion).    [provider]    Family History Family History  Problem Relation Age of Onset   Osteoarthritis Mother    Hypertension Father    Hypertension Sister     Social History Social History   Tobacco Use   Smoking status: Former    Types: Cigarettes   Smokeless tobacco: Never  Vaping Use   Vaping Use: Never used  Substance Use Topics   Alcohol use: No   Drug use: No     Allergies   Ibuprofen   Review of Systems Review of Systems  Constitutional:  Positive for  activity change. Negative for appetite change, fatigue and fever.  Gastrointestinal:  Negative for abdominal pain, diarrhea, nausea and vomiting.  Musculoskeletal:  Positive for arthralgias and neck pain. Negative for myalgias.  Neurological:  Negative for dizziness, weakness, light-headedness, numbness and headaches.     Physical Exam Triage Vital Signs ED Triage Vitals  Enc Vitals Group     BP 09/07/22 1545 135/85     Pulse Rate 09/07/22 1545 69     Resp 09/07/22 1545 12     Temp 09/07/22 1545 98 F (36.7 C)     Temp Source 09/07/22 1545 Oral     SpO2 09/07/22 1545 98 %     Weight --      Height --      Head Circumference --      Peak Flow --      Pain Score 09/07/22 1544 10      Pain Loc --      Pain Edu? --      Excl. in [place]? --    No data found.  Updated Vital Signs BP 135/85 (BP Location: Right Arm)   Pulse 69   Temp 98 F (36.7 C) (Oral)   Resp 12   SpO2 98%   Visual Acuity Right Eye Distance:   Left Eye Distance:   Bilateral Distance:    Right Eye Near:   Left Eye Near:    Bilateral Near:     Physical Exam Vitals reviewed.  Constitutional:      General: She is awake. She is not in acute distress.    Appearance: Normal appearance. She is well-developed. She is not ill-appearing.     Comments: Very pleasant female appears stated age no acute distress sitting comfortably in exam room  HENT:     Head: Normocephalic and atraumatic.  Cardiovascular:     Rate and Rhythm: Normal rate and regular rhythm.     Pulses:          Radial pulses are 2+ on the left side.     Heart sounds: Normal heart sounds, S1 normal and S2 normal. No murmur heard. Pulmonary:     Effort: Pulmonary effort is normal.     Breath sounds: Normal breath sounds. No wheezing, rhonchi or rales.     Comments: Clear to auscultation bilaterally Musculoskeletal:     Left shoulder: Swelling and tenderness present. No bony tenderness. Decreased range of motion.     Cervical back: Spasms and tenderness present. No bony tenderness.     Thoracic back: Tenderness present. No bony tenderness.     Lumbar back: Tenderness present. No bony tenderness.     Comments: Left shoulder: Tenderness palpation over AC joint and along trapezius.  Spasm noted along left trapezius.  Decreased range of motion with overhead flexion, extension, abduction secondary to pain.  Normal pincer grip strength.  Decreased strength at shoulder secondary to pain.  Tenderness palpation over left cervical/thoracic/lumbar paraspinal muscles.  No deformity noted.  No pain percussion of vertebrae.  Psychiatric:        Behavior: Behavior is cooperative.      UC Treatments / Results  Labs (all labs ordered are  listed, but only abnormal results are displayed) Labs Reviewed - No data to display  EKG   Radiology DG Shoulder Left  Result Date: 09/07/2022 CLINICAL DATA:  Pain and swelling. EXAM: LEFT SHOULDER - 2+ VIEW COMPARISON:  Left shoulder x-ray 03/24/2016 FINDINGS: There is no evidence of fracture or dislocation. There is  moderate glenohumeral joint space narrowing and osteophyte formation which has progressed compared to the prior study. There is also subacromial joint space narrowing, progressed from prior. Soft tissues are within normal limits. IMPRESSION: 1. Progression of moderate degenerative changes of the glenohumeral joint. 2. Subacromial joint space narrowing can be seen with chronic rotator cuff tendinopathy. Electronically Signed   By: Darliss Cheney M.D.   On: 09/07/2022 16:22    Procedures Procedures (including critical care time)  Medications Ordered in UC Medications - No data to display  Initial Impression / Assessment and Plan / UC Course  I have reviewed the triage vital signs and the nursing notes.  Pertinent labs & imaging results that were available during my care of the patient were reviewed by me and considered in my medical decision making (see chart for details).     Patient is well-appearing, afebrile, nontoxic, nontachycardic.  X-ray was obtained given previous surgery involving this shoulder with decreased range of motion and increasing pain which showed no acute osseous abnormality but did show progression of arthritis and evidence of possible chronic rotator cuff tendinopathy.  Patient was encouraged to use heat and gentle stretch for symptom relief.  She is unable to take NSAIDs but was encouraged to use topical Voltaren as she has tolerated this in the past as well as acetaminophen for pain relief.  She was started on Robaxin up to 3 times a day.  Discussed this can be sedating and she is not to drive or drink alcohol while taking it.  Low suspicion for VTE event  given she has no risk factors and her vitals are normal today.  We did discuss that if she has any worsening symptoms including erythema, worsening swelling, increasing pain she should be reevaluated at which point we will consider an ultrasound.  Recommended follow-up with sports medicine and was given contact information for local provider with instruction to call to schedule an appointment.  Discussed that if anything worsens suddenly and she has weakness, numbness, paresthesias she should be seen immediately.  Strict return precautions given.  Work excuse note provided.  Final Clinical Impressions(s) / UC Diagnoses   Final diagnoses:  Primary osteoarthritis of left shoulder  Acute pain of left shoulder  Trapezius muscle spasm     Discharge Instructions      Your x-ray showed worsening arthritis.  I am concerned that you have strained a muscle which has aggravated your arthritis.  Please continue your Tylenol as well as topical Voltaren.  Use methocarbamol up to 3 times a day.  This will make you sleepy so do not drive or drink alcohol while taking it.  I think you should establish/follow-up with a sports medicine provider.  Call them to schedule an appointment.  If you have any worsening symptoms including increased swelling, redness, increased pain, weakness you need to be seen immediately.     ED Prescriptions     Medication Sig Dispense Auth. Provider   methocarbamol (ROBAXIN) 500 MG tablet  (Status: Discontinued) Take 1 tablet (500 mg total) by mouth every 8 (eight) hours as needed for muscle spasms. 21 tablet Kion Huntsberry K, PA-C   methocarbamol (ROBAXIN) 500 MG tablet Take 1 tablet (500 mg total) by mouth every 8 (eight) hours as needed for muscle spasms. 21 tablet Sara Arias, Noberto Retort, PA-C      PDMP not reviewed this encounter.   Sara Hawking, PA-C 09/07/22 1633    Sara Arias, Noberto Retort, PA-C 09/07/22 1638

## 2022-09-07 NOTE — ED Triage Notes (Signed)
Pt is here for back pain and left shoulder pain x 2days

## 2022-09-09 ENCOUNTER — Ambulatory Visit (INDEPENDENT_AMBULATORY_CARE_PROVIDER_SITE_OTHER): Payer: No Typology Code available for payment source | Admitting: Sports Medicine

## 2022-09-09 VITALS — BP 164/86 | Ht 64.0 in | Wt 215.0 lb

## 2022-09-09 DIAGNOSIS — M25512 Pain in left shoulder: Secondary | ICD-10-CM | POA: Diagnosis not present

## 2022-09-09 MED ORDER — METHYLPREDNISOLONE ACETATE 40 MG/ML IJ SUSP
40.0000 mg | Freq: Once | INTRAMUSCULAR | Status: AC
  Administered 2022-09-09: 40 mg via INTRA_ARTICULAR

## 2022-09-09 NOTE — Progress Notes (Signed)
KEILEE DENMAN - 57 y.o. female MRN [id number]  Date of birth: 21-Sep-1965    CHIEF COMPLAINT:   L shoulder pain    SUBJECTIVE:   HPI:  Pleasant 57 year old female with past medical history of massive left shoulder rotator cuff tear status post arthroscopic repair done in 2018 in [place].  She comes in today to be evaluated for left shoulder pain.  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  About a week ago she was helping a patient by helping her stand in the shower.  She tried to prevent the patient from falling and jerked her left shoulder back and external rotation.  Ever since then she has had sharp pain over the anterior lateral aspect of the shoulder.  She was evaluated urgent care where she had an x-ray done and was given muscle relaxer.  Her x-rays show progression of moderate degenerative changes of the glenohumeral joint along with subacromial joint space narrowing which can be seen with chronic rotator cuff tendinopathy.  She has been taking muscle relaxer which has helped improve her pain to a certain degree.  However, she is still unable to fully move the arm.  The pain is made worse with trying to move it overhead.  She denies any numbness or tingling down the arm.  She is unable to take NSAIDs due to an allergy.  ROS:     See HPI  PERTINENT  PMH / PSH FH / / SH:  Past Medical, Surgical, Social, and Family History Reviewed & Updated in the EMR.  Pertinent findings include:  none  OBJECTIVE: BP (!) 164/86   Ht 5\' 4"  (1.626 m)   Wt 215 lb (97.5 kg)   BMI 36.90 kg/m   Physical Exam:  Vital signs are reviewed.  GEN: Alert and oriented, NAD Pulm: Breathing unlabored PSY: normal mood, congruent affect  MSK: Left shoulder -no obvious deformity.  Well-healed arthroscopic incisions.  She is nontender to palpation of the clavicle.  She is mildly tender palpation over the [facility] joint and biceps tendon.  Range of motion: Active range of motion abduction  to 30 degrees, forward flexion to 30 degrees, external rotation to 10 degrees, unable to perform internal rotation.  Positive painful arc.  Unable to tolerate Hawkins test.  Unable to test cuff strength secondary to pain.  Neurovascularly intact distally.  ASSESSMENT & PLAN:  1.  Left shoulder pain, acute on chronic  -Likely exacerbation of osteoarthritis with combination of subacromial impingement.  She certainly has a component of arthritis and subacromial space narrowing on her x-ray.  Given her inability to take NSAIDs, she will likely benefit from a subacromial corticosteroid injection today followed by aggressive home exercise plan.  Will have her follow-up in 4 to 6 weeks to check her progress.  If no improvement, would perform a complete total ultrasound versus sending her back to her orthopedic surgeon in [place] to discuss MRI/surgical options. Work note given, no heavy lifting/pushing/pulling/overhead lifting with LUE. All questions were answered she agrees to plan.  Procedure performed:  Left Shoulder Subacromial Corticosteroid Injection; palpation guided  Consent obtained and verified. Time-out conducted. Noted no overlying erythema, induration, or other signs of local infection. The left posterior subacromial space was palpated and marked. The overlying skin was prepped in a sterile fashion. Topical analgesic spray: Ethyl chloride. Needle: 22 gauge, 1.5 inch Meds: 3 cc 1% lidocaine without epinephrine, 40 mg depo-medrol Completed without difficulty.  Advised to call if fevers/chills, erythema,  induration, drainage, or persistent bleeding.   Dortha Kern, MD PGY-4, Sports Medicine Fellow [place]  Addendum:  I was the preceptor for this visit and available for immediate consultation.  Karlton Lemon MD [name]

## 2022-09-11 ENCOUNTER — Encounter: Payer: Self-pay | Admitting: Sports Medicine

## 2022-09-15 ENCOUNTER — Other Ambulatory Visit: Payer: Self-pay | Admitting: *Deleted

## 2022-09-17 ENCOUNTER — Other Ambulatory Visit: Payer: Self-pay | Admitting: *Deleted

## 2022-10-14 ENCOUNTER — Ambulatory Visit (INDEPENDENT_AMBULATORY_CARE_PROVIDER_SITE_OTHER): Payer: No Typology Code available for payment source | Admitting: Sports Medicine

## 2022-10-14 VITALS — BP 132/84 | Ht 64.0 in | Wt 190.0 lb

## 2022-10-14 DIAGNOSIS — M25512 Pain in left shoulder: Secondary | ICD-10-CM | POA: Diagnosis not present

## 2022-10-14 MED ORDER — METHOCARBAMOL 500 MG PO TABS: 500.0000 mg | ORAL_TABLET | Freq: Three times a day (TID) | ORAL | 0 refills | Status: DC | PRN

## 2022-10-14 NOTE — Progress Notes (Signed)
  Sara Arias - 57 y.o. female MRN [id number]  Date of birth: 01-11-1966    CHIEF COMPLAINT:   Left shoulder pain    SUBJECTIVE:   HPI:  Pleasant 57 year old female comes to clinic for follow-up of left shoulder pain.  She hurt her shoulder while lifting a patient at work back in early January.  At her last visit we did a subacromial injection.  She got temporary pain relief from that for a few days but her pain eventually returned.  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  Today she says the pain is pretty much the same.  Robaxin relieves the pain a little bit but she has run out of that prescription.  It has not gotten better with her home exercises that she has been doing for the last 5 weeks.  She would like to go back to see her surgeon in [place] who did her rotator cuff surgery 2018.  ROS:     See HPI  PERTINENT  PMH / PSH FH / / SH:  Past Medical, Surgical, Social, and Family History Reviewed & Updated in the EMR.  Pertinent findings include:  none  OBJECTIVE: BP 132/84   Ht 5\' 4"  (1.626 m)   Wt 190 lb (86.2 kg)   BMI 32.61 kg/m   Physical Exam:  Vital signs are reviewed.  GEN: Alert and oriented, NAD Pulm: Breathing unlabored PSY: normal mood, congruent affect  MSK: Left shoulder -no obvious deformity.  She is tender to palpation over the distal clavicle, AC joint, and anteriorly.  Active range of motion of the shoulder in abduction is 30 degrees, active range of motion forward flexion is 30 degrees.  Active external rotation to 15 degrees.  Unable to tolerate internal rotation.  Unable to tolerate any strength testing.  ASSESSMENT & PLAN:  1.  Left shoulder pain  -History and exam is concerning for shoulder pain due to an acute traumatic cuff tear or potentially frozen shoulder.  She has not shown any substantial  improvement with conservative treatment including home exercises and subacromial injection.  At this point it will be important to better understand the integrity of her cuff by obtaining an MRI.  That way she will be armed with all the information needed prior to returning to see Dr. Prudy Feeler in [place].  I will refill her Robaxin today for symptomatic relief in the meantime.  Will have her get set up for an MRI and discussed the results with her and potential next steps.  All questions were answered and she agrees to plan.  Dortha Kern, MD PGY-4, Sports Medicine Fellow [place]  Addendum:  I was the preceptor for this visit and available for immediate consultation.  Karlton Lemon MD [name]

## 2022-10-29 ENCOUNTER — Ambulatory Visit
Admission: RE | Admit: 2022-10-29 | Discharge: 2022-10-29 | Disposition: A | Discharge status: Home / Self Care | Payer: Worker's Compensation | Source: Ambulatory Visit | Attending: Family Medicine | Admitting: Family Medicine

## 2022-10-29 DIAGNOSIS — M25512 Pain in left shoulder: Secondary | ICD-10-CM

## 2022-11-15 ENCOUNTER — Encounter (HOSPITAL_COMMUNITY): Payer: Self-pay

## 2022-11-15 ENCOUNTER — Ambulatory Visit (HOSPITAL_COMMUNITY)
Admission: EM | Admit: 2022-11-15 | Discharge: 2022-11-15 | Disposition: A | Discharge status: Home / Self Care | Payer: Medicaid Other | Attending: Emergency Medicine | Admitting: Emergency Medicine

## 2022-11-15 DIAGNOSIS — J3089 Other allergic rhinitis: Secondary | ICD-10-CM | POA: Diagnosis not present

## 2022-11-15 MED ORDER — FLUTICASONE PROPIONATE 50 MCG/ACT NA SUSP: 1.0000 | Freq: Every day | NASAL | 2 refills | Status: DC

## 2022-11-15 MED ORDER — GUAIFENESIN ER 600 MG PO TB12: 1200.0000 mg | ORAL_TABLET | Freq: Two times a day (BID) | ORAL | 0 refills | Status: AC

## 2022-11-15 MED ORDER — CETIRIZINE HCL 10 MG PO TABS: 10.0000 mg | ORAL_TABLET | Freq: Every day | ORAL | 2 refills | Status: AC

## 2022-11-15 NOTE — ED Provider Notes (Signed)
[place]    CSN: [id number] Arrival date & time: 11/15/22  1312      History   Chief Complaint Chief Complaint  Patient presents with   Cough   Fever   Headache   Shortness of Breath    HPI Sara Arias is a 57 y.o. female.   Reports ongoing cough, fever, headaches nasal congestion. She reports she has seasonal allergies, not currently on any medications. Does have second hand smoke exposure. Had sweats last night, no documented fever, she turned the heat off in her home, the room temperature was 80 degrees.   Denies CP, SOB, N/V/D or recent sick contacts. Requesting Covid and flu testing.    The history is provided by the patient.  Cough Associated symptoms: headaches, rhinorrhea and sore throat   Associated symptoms: no chest pain   Fever Associated symptoms: cough, headaches, rhinorrhea and sore throat   Associated symptoms: no chest pain   Headache Associated symptoms: cough and sore throat   Associated symptoms: no abdominal pain   Shortness of Breath Associated symptoms: cough, headaches and sore throat   Associated symptoms: no abdominal pain and no chest pain     Past Medical History:  Diagnosis Date   Knee pain    Obesity    Panic attacks     There are no problems to display for this patient.   Past Surgical History:  Procedure Laterality Date   PARTIAL HYSTERECTOMY     SHOULDER SURGERY      OB History   No obstetric history on file.      Home Medications    Prior to Admission medications   Medication Sig Start Date End Date Taking? Authorizing Provider  cetirizine (ZYRTEC ALLERGY) 10 MG tablet Take 1 tablet (10 mg total) by mouth daily. 11/15/22 02/13/23 Yes Louretta Shorten, [place] N, FNP  fluticasone [facility]) 50 MCG/ACT nasal spray Place 1 spray into both nostrils daily. 11/15/22  Yes Louretta Shorten, [place] N, FNP  guaiFENesin (MUCINEX) 600 MG 12 hr tablet Take 2 tablets (1,200 mg total) by mouth 2 (two) times daily for 5 days.  11/15/22 11/20/22 Yes Louretta Shorten, [place] N, FNP  acetaminophen (TYLENOL) 500 MG tablet Take 1 tablet (500 mg total) by mouth every 6 (six) hours as needed. 01/30/22   Rising, Wells Guiles, PA-C  Biotin w/ Vitamins C & E (HAIR SKIN & NAILS GUMMIES) 1250-7.5-7.5 MCG-MG-UNT CHEW Chew 2 each by mouth daily.    [provider]  Camphor-Menthol-Methyl Sal (SALONPAS EX) Apply topically.    [provider]    Family History Family History  Problem Relation Age of Onset   Osteoarthritis Mother    Hypertension Father    Hypertension Sister     Social History Social History   Tobacco Use   Smoking status: Former    Types: Cigarettes   Smokeless tobacco: Never  Vaping Use   Vaping Use: Never used  Substance Use Topics   Alcohol use: No   Drug use: No     Allergies   Ibuprofen   Review of Systems Review of Systems  HENT:  Positive for rhinorrhea and sore throat.   Respiratory:  Positive for cough.   Cardiovascular:  Negative for chest pain.  Gastrointestinal:  Negative for abdominal pain.  Neurological:  Positive for headaches.     Physical Exam Triage Vital Signs ED Triage Vitals  Enc Vitals Group     BP 11/15/22 1411 (!) 160/90     Pulse Rate 11/15/22 1411  90     Resp 11/15/22 1411 18     Temp 11/15/22 1411 98.3 F (36.8 C)     Temp Source 11/15/22 1411 Oral     SpO2 11/15/22 1411 95 %     Weight --      Height --      Head Circumference --      Peak Flow --      Pain Score 11/15/22 1414 10     Pain Loc --      Pain Edu? --      Excl. in [place]? --    No data found.  Updated Vital Signs BP (!) 160/90 (BP Location: Right Arm)   Pulse 90   Temp 98.3 F (36.8 C) (Oral)   Resp 18   SpO2 95%   Visual Acuity Right Eye Distance:   Left Eye Distance:   Bilateral Distance:    Right Eye Near:   Left Eye Near:    Bilateral Near:     Physical Exam Vitals and nursing note reviewed.  Constitutional:      Appearance: She is well-developed.  HENT:      Head: Normocephalic and atraumatic.     Mouth/Throat:     Mouth: Mucous membranes are moist.     Pharynx: Oropharynx is clear.  Eyes:     General: No scleral icterus.    Extraocular Movements: Extraocular movements intact.     Pupils: Pupils are equal, round, and reactive to light. Pupils are equal.  Cardiovascular:     Rate and Rhythm: Normal rate and regular rhythm.     Heart sounds: Normal heart sounds, S1 normal and S2 normal. No murmur heard. Pulmonary:     Effort: Pulmonary effort is normal. No respiratory distress.     Breath sounds: Normal breath sounds. No wheezing.     Comments: Lungs vesicular posteriorly. Musculoskeletal:     Cervical back: Normal range of motion.  Lymphadenopathy:     Cervical: Cervical adenopathy present.  Neurological:     Mental Status: She is alert and oriented to person, place, and time.     GCS: GCS eye subscore is 4. GCS verbal subscore is 5. GCS motor subscore is 6.  Psychiatric:        Behavior: Behavior is cooperative.      UC Treatments / Results  Labs (all labs ordered are listed, but only abnormal results are displayed) Labs Reviewed - No data to display  EKG   Radiology No results found.  Procedures Procedures (including critical care time)  Medications Ordered in UC Medications - No data to display  Initial Impression / Assessment and Plan / UC Course  I have reviewed the triage vital signs and the nursing notes.  Pertinent labs & imaging results that were available during my care of the patient were reviewed by me and considered in my medical decision making (see chart for details).  Vitals and triage reviewed, patient is hemodynamically stable.  Due to duration of symptoms, deferred COVID and flu testing.  Lungs vesicular posteriorly, afebrile and without tachycardia, low concern for bacterial infection.  Patient with postnasal drip, throat irritation, headache and nasal congestion.  Reports bilateral eye itching and  sneezing.  Feel as if symptoms are related to allergic rhinitis, will start on daily antihistamine and provide symptomatic relief.  Return precautions and follow-up care discussed, patient verbalized understanding.  No questions at this time.   Final Clinical Impressions(s) / UC Diagnoses   Final diagnoses:  Seasonal allergic rhinitis due to other allergic trigger     Discharge Instructions      Overall your physical exam was reassuring.  Please take the daily antihistamine cetirizine to help with your allergy symptoms.  You can use the Flonase and Mucinex to help with your nasal congestion.  You can take Tylenol as needed for your headaches, this should resolve as your congestion improves.  Please drink 64 ounces of water daily and limit your exposure to irritants and secondhand smoke.  Please return to clinic if you develop worsening shortness of breath, chest pain, fevers, or new concerning symptoms.      ED Prescriptions     Medication Sig Dispense Auth. Provider   cetirizine (ZYRTEC ALLERGY) 10 MG tablet Take 1 tablet (10 mg total) by mouth daily. 30 tablet Louretta Shorten, [place] N, [place]   guaiFENesin (MUCINEX) 600 MG 12 hr tablet Take 2 tablets (1,200 mg total) by mouth 2 (two) times daily for 5 days. 20 tablet Louretta Shorten, [place], FNP   fluticasone [facility]) 50 MCG/ACT nasal spray Place 1 spray into both nostrils daily. 9.9 mL Kyleena Scheirer, [place] N, FNP      PDMP not reviewed this encounter.   Keilen Kahl, [place] N, [place] 11/15/22 [phone]

## 2022-11-15 NOTE — Discharge Instructions (Addendum)
Overall your physical exam was reassuring.  Please take the daily antihistamine cetirizine to help with your allergy symptoms.  You can use the Flonase and Mucinex to help with your nasal congestion.  You can take Tylenol as needed for your headaches, this should resolve as your congestion improves.  Please drink 64 ounces of water daily and limit your exposure to irritants and secondhand smoke.  Please return to clinic if you develop worsening shortness of breath, chest pain, fevers, or new concerning symptoms.

## 2022-11-15 NOTE — ED Triage Notes (Signed)
Patient c/o a non productive cough, sneezing, watery eyes, fever, headache, SOB x 1 week.  Patient states she has been taking Benadryl for allergies and the last dose was last night.

## 2022-12-02 ENCOUNTER — Ambulatory Visit (INDEPENDENT_AMBULATORY_CARE_PROVIDER_SITE_OTHER): Payer: No Typology Code available for payment source | Admitting: Sports Medicine

## 2022-12-02 VITALS — BP 126/73 | Ht 64.0 in | Wt 190.0 lb

## 2022-12-02 DIAGNOSIS — M75122 Complete rotator cuff tear or rupture of left shoulder, not specified as traumatic: Secondary | ICD-10-CM | POA: Diagnosis not present

## 2022-12-02 NOTE — Patient Instructions (Addendum)
We are going to refer you to Dr. Griffin Basil for your left shoulder.  [facility] [place] [phone]  Appt: 12/15/22 @ 9:45 am, please arrive at 9:30 am.  If you need to change this appt please call their office to reschedule.

## 2022-12-02 NOTE — Progress Notes (Unsigned)
  Sara Arias - 57 y.o. female MRN [id number]  Date of birth: Feb 22, 1966    CHIEF COMPLAINT:   Left shoulder pain    SUBJECTIVE:   HPI:  Pleasant 57 year old female here to follow-up left shoulder pain.  She had an MRI done at the end of February that showed recurrent full-thickness tear of the supraspinatus tendon and majority of the infraspinatus tendon with a traction noted to the glenoid.  Grade 3 atrophy of the supraspinatus and infraspinatus muscles consistent with chronic tear.  There is also severe glenohumeral arthritis with diffuse degenerative labral tearing, moderate AC joint arthropathy, and a small joint effusion with synovitis.  She previously had her cuff repair done by a surgeon in [place] in 2018.  She wanted to return to him for his surgical opinion however she was having difficulty getting an appointment at that office.  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  ROS:     See HPI  PERTINENT  PMH / PSH FH / / SH:  Past Medical, Surgical, Social, and Family History Reviewed & Updated in the EMR.  Pertinent findings include:  ***  OBJECTIVE: BP 126/73   Ht 5\' 4"  (1.626 m)   Wt 190 lb (86.2 kg)   BMI 32.61 kg/m   Physical Exam:  Vital signs are reviewed.  GEN: Alert and oriented, NAD Pulm: Breathing unlabored PSY: normal mood, congruent affect  MSK: Left shoulder -no obvious deformity.  No erythema no ecchymoses.  She is nontender to palpation at the [facility] joint.  Active range of motion forward flexion to 90 degrees, abduction to 90 degrees, external rotation to 40 degrees, internal rotation to back pocket.  Supraspinatus testing is 3/5 strength.  3/5 strength with resisted external rotation as well.  Positive Hawkins test.  Neurovascular intact distally.   ASSESSMENT & PLAN:  1.  Left chronic rotator cuff tear  -Discussed with the patient that her MRI has findings  of a chronic cuff tear that will need surgery, likely reverse total shoulder replacement.  Since she is having difficulty seeing her old Psychologist, sport and exercise, she is open to seeing a Psychologist, sport and exercise in [place], so I have set her up with an appointment with Dr. Griffin Basil on 4/16.  I gave her an updated work note continuing no heavy lifting greater than 10 pounds with left upper extremity at least until she sees Dr. Griffin Basil.  She is also interested in long-term disability but I discussed that that will be a conversation better suited for her surgeon who will manage her recovery long-term.  She is amenable to this.  All questions were answered and she agrees to plan.  Dortha Kern, MD PGY-4, Sports Medicine [facility] [place]

## 2022-12-15 ENCOUNTER — Other Ambulatory Visit: Payer: Self-pay | Admitting: Orthopaedic Surgery

## 2022-12-15 DIAGNOSIS — Z01818 Encounter for other preprocedural examination: Secondary | ICD-10-CM

## 2022-12-21 ENCOUNTER — Other Ambulatory Visit: Payer: Medicaid Other

## 2023-01-18 ENCOUNTER — Other Ambulatory Visit: Payer: Medicaid Other

## 2023-03-01 ENCOUNTER — Ambulatory Visit (HOSPITAL_COMMUNITY)
Admission: EM | Admit: 2023-03-01 | Discharge: 2023-03-01 | Disposition: A | Discharge status: Home / Self Care | Payer: No Typology Code available for payment source | Attending: Urgent Care | Admitting: Urgent Care

## 2023-03-01 ENCOUNTER — Encounter (HOSPITAL_COMMUNITY): Payer: Self-pay

## 2023-03-01 DIAGNOSIS — M940 Chondrocostal junction syndrome [Tietze]: Secondary | ICD-10-CM | POA: Diagnosis not present

## 2023-03-01 DIAGNOSIS — J209 Acute bronchitis, unspecified: Secondary | ICD-10-CM

## 2023-03-01 MED ORDER — PREDNISONE 10 MG (21) PO TBPK: ORAL_TABLET | Freq: Every day | ORAL | 0 refills | Status: DC

## 2023-03-01 MED ORDER — MONTELUKAST SODIUM 10 MG PO TABS: 10.0000 mg | ORAL_TABLET | Freq: Every day | ORAL | 0 refills | Status: DC

## 2023-03-01 MED ORDER — ALBUTEROL SULFATE HFA 108 (90 BASE) MCG/ACT IN AERS: 1.0000 | INHALATION_SPRAY | Freq: Four times a day (QID) | RESPIRATORY_TRACT | 0 refills | Status: DC | PRN

## 2023-03-01 NOTE — Discharge Instructions (Addendum)
You have bronchitis. Please start taking the steroid taper pack as prescribed, best taken in the morning.  Continue taking zyrtec once daily to help clear up the mucous.  Use montelukast nightly to help with inflammation of the nasal passage. It is also recommended that you use nasal saline/ sinus washes to cleans the sinus passages.  Hot steam from a shower or vaporizer may also be beneficial to help open up the upper airway. Eucalyptus can be helpful.  If any worsening symptoms such as headache, fever, or shortness of breath, please return for recheck.  The cough with bronchitis can sometimes linger for several weeks, this is normal. Please read the attached handout regarding this condition.

## 2023-03-01 NOTE — ED Triage Notes (Signed)
Patient here today with c/o head and chest congestion with cough, SOB, and HA X 3 days. She has taken Tylenol with some improvement with the pain. 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 She also tried Nyquil with no relief.

## 2023-03-01 NOTE — ED Provider Notes (Signed)
[facility]    CSN: [id number] Arrival date & time: 03/01/23  0800      History   Chief Complaint Chief Complaint  Patient presents with   Nasal Congestion    Chest Congestion    HPI Sara Arias is a 57 y.o. female.   Pleasant 57yo female presents today with concerns of sore throat and cough x 3 days.  Patient states she works 2 jobs, 1 as a Engineer, structural and another in retirement center.  She states she has been around numerous people that have been sick recently with coughs.  Patient states her cough started on Saturday, and reports bilateral chest discomfort to palpation.  She states her cough is primarily dry with only scant production.  She does report some shortness of breath during these coughing spells, but otherwise no significant dyspnea.  She does have a history of bronchitis requiring inhalers in the past, called her pharmacy who was unable to refill.  She took some Tylenol on Saturday as she felt like she had a fever, but did not check her temperature.  She also reports a sore throat that started yesterday.  Endorses postnasal drainage, sinus headache in the frontal region.  No additional symptoms at this time.     Past Medical History:  Diagnosis Date   Knee pain    Obesity    Panic attacks     There are no problems to display for this patient.   Past Surgical History:  Procedure Laterality Date   PARTIAL HYSTERECTOMY     SHOULDER SURGERY      OB History   No obstetric history on file.      Home Medications    Prior to Admission medications   Medication Sig Start Date End Date Taking? Authorizing Provider  acetaminophen (TYLENOL) 500 MG tablet Take 1 tablet (500 mg total) by mouth every 6 (six) hours as needed. 01/30/22  Yes Rising, Lurena Joiner, PA-C  albuterol (VENTOLIN HFA) 108 (90 Base) MCG/ACT inhaler Inhale 1-2 puffs into the lungs every 6 (six) hours as needed for wheezing or shortness of breath. 03/01/23  Yes Anyiah Coverdale L, PA  cetirizine  (ZYRTEC ALLERGY) 10 MG tablet Take 1 tablet (10 mg total) by mouth daily. 11/15/22 03/01/23 Yes Rinaldo Ratel, [place] N, FNP  montelukast (SINGULAIR) 10 MG tablet Take 1 tablet (10 mg total) by mouth at bedtime. 03/01/23  Yes Sanna Porcaro L, PA  predniSONE (STERAPRED UNI-PAK 21 TAB) 10 MG (21) TBPK tablet Take by mouth daily. Take 6 tabs by mouth daily  for 1 days, then 5 tabs for 1 days, then 4 tabs for 1 days, then 3 tabs for 1 days, 2 tabs for 1 days, then 1 tab by mouth daily for 1 days 03/01/23  Yes Lakeem Rozo L, PA    Family History Family History  Problem Relation Age of Onset   Osteoarthritis Mother    Hypertension Father    Hypertension Sister     Social History Social History   Tobacco Use   Smoking status: Former    Types: Cigarettes   Smokeless tobacco: Never  Vaping Use   Vaping Use: Never used  Substance Use Topics   Alcohol use: No   Drug use: No     Allergies   Ibuprofen   Review of Systems Review of Systems As per HPI  Physical Exam Triage Vital Signs ED Triage Vitals  Enc Vitals Group     BP 03/01/23 0824 128/77  Pulse Rate 03/01/23 0824 84     Resp 03/01/23 0824 16     Temp 03/01/23 0824 97.9 F (36.6 C)     Temp Source 03/01/23 0824 Oral     SpO2 03/01/23 0824 97 %     Weight 03/01/23 0821 198 lb (89.8 kg)     Height 03/01/23 0821 5\' 4"  (1.626 m)     Head Circumference --      Peak Flow --      Pain Score 03/01/23 0819 10     Pain Loc --      Pain Edu? --      Excl. in GC? --    No data found.  Updated Vital Signs BP 128/77 (BP Location: Left Arm)   Pulse 84   Temp 97.9 F (36.6 C) (Oral)   Resp 16   Ht 5\' 4"  (1.626 m)   Wt 198 lb (89.8 kg)   SpO2 97%   BMI 33.99 kg/m   Visual Acuity Right Eye Distance:   Left Eye Distance:   Bilateral Distance:    Right Eye Near:   Left Eye Near:    Bilateral Near:     Physical Exam Vitals and nursing note reviewed.  Constitutional:      General: She is not in acute distress.     Appearance: Normal appearance. She is normal weight. She is not ill-appearing, toxic-appearing or diaphoretic.  HENT:     Head: Normocephalic and atraumatic.     Salivary Glands: Right salivary gland is not diffusely enlarged or tender. Left salivary gland is not diffusely enlarged or tender.     Right Ear: Ear canal and external ear normal. A middle ear effusion is present. Tympanic membrane is not injected, scarred or erythematous.     Left Ear: Ear canal and external ear normal. A middle ear effusion is present. Tympanic membrane is not injected, scarred or erythematous.     Nose: Congestion present. No rhinorrhea.     Right Sinus: No maxillary sinus tenderness or frontal sinus tenderness.     Left Sinus: No maxillary sinus tenderness.     Mouth/Throat:     Lips: Pink.     Mouth: Mucous membranes are moist. No oral lesions.     Tongue: No lesions.     Palate: No mass.     Pharynx: Oropharynx is clear. Uvula midline. No pharyngeal swelling, oropharyngeal exudate, posterior oropharyngeal erythema or uvula swelling.     Tonsils: No tonsillar exudate or tonsillar abscesses.  Cardiovascular:     Rate and Rhythm: Normal rate and regular rhythm.     Pulses: Normal pulses.     Heart sounds: Normal heart sounds. No murmur heard.    No friction rub. No gallop.  Pulmonary:     Effort: Pulmonary effort is normal. No respiratory distress.     Breath sounds: No stridor. Wheezing (scant bibasilar) present. No rhonchi or rales.  Chest:     Chest wall: Tenderness (reproducible bilaterally anterior chest near sternum) present.  Musculoskeletal:     Cervical back: Normal range of motion and neck supple. No rigidity or tenderness.  Lymphadenopathy:     Cervical: No cervical adenopathy.  Skin:    General: Skin is warm and dry.     Coloration: Skin is not jaundiced.     Findings: No bruising, erythema or rash.  Neurological:     General: No focal deficit present.     Mental Status: She is alert and  oriented  to person, place, and time.  Psychiatric:        Mood and Affect: Mood normal.        Behavior: Behavior normal.      UC Treatments / Results  Labs (all labs ordered are listed, but only abnormal results are displayed) Labs Reviewed - No data to display  EKG   Radiology No results found.  Procedures Procedures (including critical care time)  Medications Ordered in UC Medications - No data to display  Initial Impression / Assessment and Plan / UC Course  I have reviewed the triage vital signs and the nursing notes.  Pertinent labs & imaging results that were available during my care of the patient were reviewed by me and considered in my medical decision making (see chart for details).     Acute bronchitis - pt with hx of the same, former smoker. Has done well with albuterol inhalers in the past. Will refill. Likely viral, pt has no exam findings concerning for bacterial source. Will start with pred taper and montelukast. Pt to continue her zyrtec in the setting of known allergies.  Costochondritis - prednisone will help with this as well. Moist heat/ compresses recommended.   Final Clinical Impressions(s) / UC Diagnoses   Final diagnoses:  Acute bronchitis, unspecified organism  Costochondritis     Discharge Instructions      You have bronchitis. Please start taking the steroid taper pack as prescribed, best taken in the morning.  Continue taking zyrtec once daily to help clear up the mucous.  Use montelukast nightly to help with inflammation of the nasal passage. It is also recommended that you use nasal saline/ sinus washes to cleans the sinus passages.  Hot steam from a shower or vaporizer may also be beneficial to help open up the upper airway. Eucalyptus can be helpful.  If any worsening symptoms such as headache, fever, or shortness of breath, please return for recheck.  The cough with bronchitis can sometimes linger for several weeks, this is  normal. Please read the attached handout regarding this condition.     ED Prescriptions     Medication Sig Dispense Auth. Provider   predniSONE (STERAPRED UNI-PAK 21 TAB) 10 MG (21) TBPK tablet Take by mouth daily. Take 6 tabs by mouth daily  for 1 days, then 5 tabs for 1 days, then 4 tabs for 1 days, then 3 tabs for 1 days, 2 tabs for 1 days, then 1 tab by mouth daily for 1 days 21 tablet Joell Buerger L, PA   albuterol (VENTOLIN HFA) 108 (90 Base) MCG/ACT inhaler Inhale 1-2 puffs into the lungs every 6 (six) hours as needed for wheezing or shortness of breath. 8 g Azyriah Nevins L, PA   montelukast (SINGULAIR) 10 MG tablet Take 1 tablet (10 mg total) by mouth at bedtime. 14 tablet Trinita Devlin L, [place]      PDMP not reviewed this encounter.   Maretta Bees, [place] 03/01/23 [phone]

## 2023-04-09 ENCOUNTER — Ambulatory Visit (HOSPITAL_COMMUNITY)
Admission: RE | Admit: 2023-04-09 | Discharge: 2023-04-09 | Disposition: A | Discharge status: Home / Self Care | Payer: No Typology Code available for payment source | Source: Ambulatory Visit | Attending: Physician Assistant | Admitting: Physician Assistant

## 2023-04-09 ENCOUNTER — Encounter (HOSPITAL_COMMUNITY): Payer: Self-pay

## 2023-04-09 ENCOUNTER — Ambulatory Visit (HOSPITAL_COMMUNITY): Payer: Medicaid Other

## 2023-04-09 VITALS — BP 130/83 | HR 85 | Temp 98.0°F | Resp 16 | Ht 64.0 in | Wt 189.0 lb

## 2023-04-09 DIAGNOSIS — M25571 Pain in right ankle and joints of right foot: Secondary | ICD-10-CM

## 2023-04-09 DIAGNOSIS — S9001XA Contusion of right ankle, initial encounter: Secondary | ICD-10-CM | POA: Diagnosis not present

## 2023-04-09 NOTE — ED Triage Notes (Signed)
Patient states that she hit her right ankle on her bed x 5 days ago.  Her ankle has become swollen and painful.  Patient has taken Tylenol for pain.

## 2023-04-09 NOTE — Discharge Instructions (Signed)
Your x-ray did not show any evidence of fracture or dislocation.  Keep this elevated and use the brace for comfort and support.  Use ice for 15 minutes at a time multiple times per day.  Try to avoid strenuous activity including walking for long periods of time.  You can use Tylenol for pain.  If your symptoms are not improving quickly with this treatment plan follow-up with sports medicine; call to schedule an appointment.  If anything worsens you have increasing pain, swelling, numbness or tingling in your foot you need to be seen immediately.

## 2023-04-09 NOTE — ED Provider Notes (Signed)
[facility]    CSN: [id number] Arrival date & time: 04/09/23  1047      History   Chief Complaint Chief Complaint  Patient presents with   Ankle Pain    HPI Sara Arias is a 57 y.o. female.   Patient presents today with a 5-day history of right ankle swelling and pain.  Reports that she hit it on the side of her bed on Monday (04/05/2023).  She has had ongoing pain since that time.  Pain is rated 10 on a 0-10 pain scale, localized to lateral ankle, described as sharp with radiation into her lower leg, worse with ambulation, no alleviating factors identified.  She has been taking turmeric as well as Tylenol without improvement of symptoms.  She has also tried topical medications as well as Epsom salt soaks with minimal improvement of symptoms.  Denies previous injury or surgery involving her foot.  She denies any numbness or paresthesias.  She is having difficulty with her daily activities as she typically walks to work is very challenging to walk given her ongoing pain.    Past Medical History:  Diagnosis Date   Knee pain    Obesity    Panic attacks     There are no problems to display for this patient.   Past Surgical History:  Procedure Laterality Date   PARTIAL HYSTERECTOMY     SHOULDER SURGERY      OB History   No obstetric history on file.      Home Medications    Prior to Admission medications   Medication Sig Start Date End Date Taking? Authorizing Provider  acetaminophen (TYLENOL) 500 MG tablet Take 1 tablet (500 mg total) by mouth every 6 (six) hours as needed. 01/30/22  Yes Rising, Lurena Joiner, PA-C  albuterol (VENTOLIN HFA) 108 (90 Base) MCG/ACT inhaler Inhale 1-2 puffs into the lungs every 6 (six) hours as needed for wheezing or shortness of breath. 03/01/23  Yes Crain, Whitney L, PA  montelukast (SINGULAIR) 10 MG tablet Take 1 tablet (10 mg total) by mouth at bedtime. 03/01/23  Yes Crain, Whitney L, PA  cetirizine (ZYRTEC ALLERGY) 10 MG tablet Take 1  tablet (10 mg total) by mouth daily. 11/15/22 03/01/23  Garrison, [place] N, FNP  predniSONE (STERAPRED UNI-PAK 21 TAB) 10 MG (21) TBPK tablet Take by mouth daily. Take 6 tabs by mouth daily  for 1 days, then 5 tabs for 1 days, then 4 tabs for 1 days, then 3 tabs for 1 days, 2 tabs for 1 days, then 1 tab by mouth daily for 1 days 03/01/23   Maretta Bees, PA    Family History Family History  Problem Relation Age of Onset   Osteoarthritis Mother    Hypertension Father    Hypertension Sister     Social History Social History   Tobacco Use   Smoking status: Former    Types: Cigarettes   Smokeless tobacco: Never  Vaping Use   Vaping status: Never Used  Substance Use Topics   Alcohol use: No   Drug use: No     Allergies   Ibuprofen   Review of Systems Review of Systems  Constitutional:  Positive for activity change. Negative for appetite change, fatigue and fever.  Musculoskeletal:  Positive for arthralgias, gait problem and joint swelling. Negative for myalgias.  Skin:  Negative for color change and wound.  Neurological:  Negative for weakness and numbness.     Physical Exam Triage Vital Signs ED  Triage Vitals  Encounter Vitals Group     BP 04/09/23 1149 130/83     Systolic BP Percentile --      Diastolic BP Percentile --      Pulse Rate 04/09/23 1149 (!) 101     Resp 04/09/23 1149 16     Temp 04/09/23 1149 98 F (36.7 C)     Temp Source 04/09/23 1149 Oral     SpO2 04/09/23 1149 97 %     Weight 04/09/23 1151 189 lb (85.7 kg)     Height 04/09/23 1151 5\' 4"  (1.626 m)     Head Circumference --      Peak Flow --      Pain Score 04/09/23 1151 10     Pain Loc --      Pain Education --      Exclude from Growth Chart --    No data found.  Updated Vital Signs BP 130/83 (BP Location: Left Arm)   Pulse 85   Temp 98 F (36.7 C) (Oral)   Resp 16   Ht 5\' 4"  (1.626 m)   Wt 189 lb (85.7 kg)   SpO2 95%   BMI 32.44 kg/m   Visual Acuity Right Eye Distance:   Left  Eye Distance:   Bilateral Distance:    Right Eye Near:   Left Eye Near:    Bilateral Near:     Physical Exam Vitals reviewed.  Constitutional:      General: She is awake. She is not in acute distress.    Appearance: Normal appearance. She is well-developed. She is not ill-appearing.     Comments: Very pleasant female appears stated age in no acute distress sitting comfortably in exam room  HENT:     Head: Normocephalic and atraumatic.  Cardiovascular:     Rate and Rhythm: Normal rate and regular rhythm.     Heart sounds: Normal heart sounds, S1 normal and S2 normal. No murmur heard.    Comments: Capillary refill within 2 seconds right toes Pulmonary:     Effort: Pulmonary effort is normal.     Breath sounds: Normal breath sounds. No wheezing, rhonchi or rales.     Comments: Clear to auscultation bilaterally Musculoskeletal:     Right lower leg: No edema.     Left lower leg: No edema.     Right ankle: Swelling present. Tenderness present over the lateral malleolus. Decreased range of motion.     Right Achilles Tendon: No tenderness. Thompson's test negative.     Right foot: Normal range of motion and normal capillary refill. No tenderness or bony tenderness.     Comments: Right ankle/foot: Swelling noted at right ankle worse on lateral side.  Tenderness over lateral malleolus.  Foot is neurovascularly intact.  Antalgic gait.  Negative Thompson test.    Psychiatric:        Behavior: Behavior is cooperative.      UC Treatments / Results  Labs (all labs ordered are listed, but only abnormal results are displayed) Labs Reviewed - No data to display  EKG   Radiology DG Ankle Complete Right  Result Date: 04/09/2023 CLINICAL DATA:  Increasing ankle pain and swelling after injury 5 days ago. EXAM: RIGHT ANKLE - COMPLETE 3+ VIEW COMPARISON:  None Available. FINDINGS: The mineralization and alignment are normal. There is no evidence of acute fracture or dislocation. The joint  spaces appear preserved. There bidirectional calcaneal spurs. Diffuse soft tissue prominence in the visualized lower leg and  dorsal aspect of the foot which could reflect edema/swelling. No evidence of foreign body or soft tissue emphysema. IMPRESSION: No evidence of acute fracture or dislocation. Possible soft tissue swelling. No evidence of foreign body or soft tissue emphysema. Electronically Signed   By: Carey Bullocks M.D.   On: 04/09/2023 13:15    Procedures Procedures (including critical care time)  Medications Ordered in UC Medications - No data to display  Initial Impression / Assessment and Plan / UC Course  I have reviewed the triage vital signs and the nursing notes.  Pertinent labs & imaging results that were available during my care of the patient were reviewed by me and considered in my medical decision making (see chart for details).     Patient is well-appearing, afebrile, nontoxic, nontachycardic.  Based on audible ankle rules and tenderness over lateral malleolus x-ray was obtained that showed no acute osseous abnormality.  Suspect contusion versus sprain as etiology of symptoms.  Recommended RICE protocol for symptom relief.  Patient is unable to take NSAIDs due to history of reaction to ibuprofen so was encouraged to use Tylenol for pain.  She is to avoid strenuous activity including prolonged ambulation and so was given a work excuse note for 3 days.  We discussed that if her symptoms or not improving quickly she should follow-up with sports medicine and was given contact information for sports medicine provider with instruction to call to schedule appointment if needed.  We discussed that if she has any worsening or changing symptoms she needs to be seen immediately.  Strict return precautions given.  Work excuse note provided.  Final Clinical Impressions(s) / UC Diagnoses   Final diagnoses:  Contusion of right ankle, initial encounter  Acute right ankle pain      Discharge Instructions      Your x-ray did not show any evidence of fracture or dislocation.  Keep this elevated and use the brace for comfort and support.  Use ice for 15 minutes at a time multiple times per day.  Try to avoid strenuous activity including walking for long periods of time.  You can use Tylenol for pain.  If your symptoms are not improving quickly with this treatment plan follow-up with sports medicine; call to schedule an appointment.  If anything worsens you have increasing pain, swelling, numbness or tingling in your foot you need to be seen immediately.     ED Prescriptions   None    PDMP not reviewed this encounter.   Jeani Hawking, PA-C 04/09/23 1326

## 2023-06-21 ENCOUNTER — Encounter (HOSPITAL_COMMUNITY): Payer: Self-pay | Admitting: Emergency Medicine

## 2023-06-21 ENCOUNTER — Ambulatory Visit (HOSPITAL_COMMUNITY)
Admission: EM | Admit: 2023-06-21 | Discharge: 2023-06-21 | Disposition: A | Discharge status: Home / Self Care | Payer: No Typology Code available for payment source | Attending: Internal Medicine | Admitting: Internal Medicine

## 2023-06-21 DIAGNOSIS — U071 COVID-19: Secondary | ICD-10-CM

## 2023-06-21 MED ORDER — PAXLOVID (300/100) 20 X 150 MG & 10 X 100MG PO TBPK: 3.0000 | ORAL_TABLET | Freq: Two times a day (BID) | ORAL | 0 refills | Status: AC

## 2023-06-21 MED ORDER — PAXLOVID (300/100) 20 X 150 MG & 10 X 100MG PO TBPK: 3.0000 | ORAL_TABLET | Freq: Two times a day (BID) | ORAL | 0 refills | Status: DC

## 2023-06-21 MED ORDER — PROMETHAZINE-DM 6.25-15 MG/5ML PO SYRP: 5.0000 mL | ORAL_SOLUTION | Freq: Every evening | ORAL | 0 refills | Status: DC | PRN

## 2023-06-21 MED ORDER — BENZONATATE 100 MG PO CAPS: 100.0000 mg | ORAL_CAPSULE | Freq: Three times a day (TID) | ORAL | 0 refills | Status: DC

## 2023-06-21 NOTE — ED Triage Notes (Signed)
Yesterday ran fever, headache, congestion. Took covid test that was positive. Reports feeling than yesterday. Took tylenol last night.

## 2023-06-21 NOTE — ED Provider Notes (Signed)
[facility]    CSN: [id number] Arrival date & time: 06/21/23  0847      History   Chief Complaint Chief Complaint  Patient presents with   Covid Positive    HPI Sara Arias is a 57 y.o. female.   Patient presents to urgent care for evaluation of cough, sore throat, nasal congestion, fever/chills, and generalized weakness that started yesterday morning on June 20, 2023.  She went to work this morning where she took a COVID-19 test and it was positive.  There have been multiple coworkers sick with COVID-19 recently as well.  Cough is nonproductive and dry.  Reports significant nasal congestion.  Max temp at home 99.5.  No history of chronic respiratory problems, non-smoker, denies drug use.  She does not take any daily medications for chronic medical problems.  Denies nausea, vomiting, diarrhea, abdominal pain, rash, and shortness of breath/chest pain.  No recent COVID-19 diagnosis in the last 90 days.  She is up-to-date on her recent COVID-19 and influenza vaccines.  Taking over-the-counter medications with some relief.     Past Medical History:  Diagnosis Date   Knee pain    Obesity    Panic attacks     There are no problems to display for this patient.   Past Surgical History:  Procedure Laterality Date   PARTIAL HYSTERECTOMY     SHOULDER SURGERY      OB History   No obstetric history on file.      Home Medications    Prior to Admission medications   Medication Sig Start Date End Date Taking? Authorizing Provider  benzonatate (TESSALON) 100 MG capsule Take 1 capsule (100 mg total) by mouth every 8 (eight) hours. 06/21/23  Yes Carlisle Beers, FNP  promethazine-dextromethorphan (PROMETHAZINE-DM) 6.25-15 MG/5ML syrup Take 5 mLs by mouth at bedtime as needed for cough. 06/21/23  Yes Carlisle Beers, FNP  acetaminophen (TYLENOL) 500 MG tablet Take 1 tablet (500 mg total) by mouth every 6 (six) hours as needed. 01/30/22   Rising, Lurena Joiner,  PA-C  albuterol (VENTOLIN HFA) 108 (90 Base) MCG/ACT inhaler Inhale 1-2 puffs into the lungs every 6 (six) hours as needed for wheezing or shortness of breath. 03/01/23   Crain, Whitney L, PA  cetirizine (ZYRTEC ALLERGY) 10 MG tablet Take 1 tablet (10 mg total) by mouth daily. 11/15/22 03/01/23  Garrison, [place] N, FNP  montelukast (SINGULAIR) 10 MG tablet Take 1 tablet (10 mg total) by mouth at bedtime. 03/01/23   Crain, Whitney L, PA  nirmatrelvir/ritonavir (PAXLOVID, 300/100,) 20 x 150 MG & 10 x 100MG  TBPK Take 3 tablets by mouth 2 (two) times daily for 5 days. Patient GFR is greater than 60. Take nirmatrelvir (150 mg) two tablets twice daily for 5 days and ritonavir (100 mg) one tablet twice daily for 5 days. 06/21/23 06/26/23  Carlisle Beers, FNP  predniSONE (STERAPRED UNI-PAK 21 TAB) 10 MG (21) TBPK tablet Take by mouth daily. Take 6 tabs by mouth daily  for 1 days, then 5 tabs for 1 days, then 4 tabs for 1 days, then 3 tabs for 1 days, 2 tabs for 1 days, then 1 tab by mouth daily for 1 days 03/01/23   Maretta Bees, PA    Family History Family History  Problem Relation Age of Onset   Osteoarthritis Mother    Hypertension Father    Hypertension Sister     Social History Social History   Tobacco Use   Smoking  status: Former    Types: Cigarettes   Smokeless tobacco: Never  Vaping Use   Vaping status: Never Used  Substance Use Topics   Alcohol use: No   Drug use: No     Allergies   Ibuprofen   Review of Systems Review of Systems Per HPI  Physical Exam Triage Vital Signs ED Triage Vitals  Encounter Vitals Group     BP 06/21/23 1003 125/86     Systolic BP Percentile --      Diastolic BP Percentile --      Pulse Rate 06/21/23 1003 (!) 104     Resp 06/21/23 1003 17     Temp 06/21/23 1003 97.7 F (36.5 C)     Temp Source 06/21/23 1003 Oral     SpO2 06/21/23 1003 94 %     Weight --      Height --      Head Circumference --      Peak Flow --      Pain Score  06/21/23 1002 10     Pain Loc --      Pain Education --      Exclude from Growth Chart --    No data found.  Updated Vital Signs BP 125/86 (BP Location: Left Arm)   Pulse (!) 104   Temp 97.7 F (36.5 C) (Oral)   Resp 17   SpO2 94%   Visual Acuity Right Eye Distance:   Left Eye Distance:   Bilateral Distance:    Right Eye Near:   Left Eye Near:    Bilateral Near:     Physical Exam Vitals and nursing note reviewed.  Constitutional:      Appearance: She is ill-appearing. She is not toxic-appearing.  HENT:     Head: Normocephalic and atraumatic.     Right Ear: Hearing, tympanic membrane, ear canal and external ear normal.     Left Ear: Hearing, tympanic membrane, ear canal and external ear normal.     Nose: Congestion present.     Mouth/Throat:     Lips: Pink.     Mouth: Mucous membranes are moist. No injury.     Tongue: No lesions. Tongue does not deviate from midline.     Palate: No mass and lesions.     Pharynx: Oropharynx is clear. Uvula midline. Posterior oropharyngeal erythema present. No pharyngeal swelling, oropharyngeal exudate or uvula swelling.     Tonsils: No tonsillar exudate or tonsillar abscesses.  Eyes:     General: Lids are normal. Vision grossly intact. Gaze aligned appropriately.     Extraocular Movements: Extraocular movements intact.     Conjunctiva/sclera: Conjunctivae normal.  Cardiovascular:     Rate and Rhythm: Normal rate and regular rhythm.     Heart sounds: Normal heart sounds, S1 normal and S2 normal.  Pulmonary:     Effort: Pulmonary effort is normal. No respiratory distress.     Breath sounds: Normal breath sounds and air entry. No wheezing, rhonchi or rales.  Chest:     Chest wall: No tenderness.  Musculoskeletal:     Cervical back: Neck supple.     Right lower leg: No edema.     Left lower leg: No edema.  Lymphadenopathy:     Cervical: No cervical adenopathy.  Skin:    General: Skin is warm and dry.     Capillary Refill:  Capillary refill takes less than 2 seconds.     Findings: No rash.  Neurological:  General: No focal deficit present.     Mental Status: She is alert and oriented to person, place, and time. Mental status is at baseline.     Cranial Nerves: No dysarthria or facial asymmetry.  Psychiatric:        Mood and Affect: Mood normal.        Speech: Speech normal.        Behavior: Behavior normal.        Thought Content: Thought content normal.        Judgment: Judgment normal.      UC Treatments / Results  Labs (all labs ordered are listed, but only abnormal results are displayed) Labs Reviewed - No data to display  EKG   Radiology No results found.  Procedures Procedures (including critical care time)  Medications Ordered in UC Medications - No data to display  Initial Impression / Assessment and Plan / UC Course  I have reviewed the triage vital signs and the nursing notes.  Pertinent labs & imaging results that were available during my care of the patient were reviewed by me and considered in my medical decision making (see chart for details).   1.  COVID-19 Symptomatic treatment with rest and adequate fluid intake. May use OTC medications as needed for symptomatic relief. Prescriptions for further symptomatic relief sent: Tessalon Perles, Promethazine DM Lungs clear, therefore deferred imaging.  Patient is a candidate for antiviral. Paxlovid sent to pharmacy.  Most recent basic metabolic panel shows GFR greater than 60 (2020). Discussed most up to date CDC guidelines regarding quarantine and masking to prevent transmission.  Work/school excuse note given.  Counseled patient on potential for adverse effects with medications prescribed/recommended today, strict ER and return-to-clinic precautions discussed, patient verbalized understanding.    Final Clinical Impressions(s) / UC Diagnoses   Final diagnoses:  COVID-19     Discharge Instructions      You have  COVID-19 viral illness. Wear a mask for 5 days of symptoms. You may return to public within those 5 days as long as you do not have a fever. Wash hands frequently.  Take paxlovid as prescribed. Start medicine tonight. The sooner the paxlovid is started, the better it works in Public relations account executive.  - Take prescribed medicines to help with symptoms: tessalon perles, promethazine DM (drowsiness precautions with cough syrup, only take at bedtime) - Use over the counter medicines to help with symptoms as discussed: Tylenol, guaifenesin (mucinex), zyrtec, etc - Two teaspoons of honey in warm water every 4-6 hours may help with throat pains - Humidifier in your room at night to help add water the air and soothe cough  If you develop any new or worsening symptoms or do not improve in the next 2 to 3 days, please return.  If your symptoms are severe, please go to the emergency room.  Follow-up with PCP as needed.    ED Prescriptions     Medication Sig Dispense Auth. Provider   benzonatate (TESSALON) 100 MG capsule Take 1 capsule (100 mg total) by mouth every 8 (eight) hours. 21 capsule Carlisle Beers, FNP   promethazine-dextromethorphan (PROMETHAZINE-DM) 6.25-15 MG/5ML syrup Take 5 mLs by mouth at bedtime as needed for cough. 118 mL Reita May M, FNP   nirmatrelvir/ritonavir (PAXLOVID, 300/100,) 20 x 150 MG & 10 x 100MG  TBPK  (Status: Discontinued) Take 3 tablets by mouth 2 (two) times daily for 5 days. Patient GFR is greater than 60. Take nirmatrelvir (150 mg) two tablets twice daily for  5 days and ritonavir (100 mg) one tablet twice daily for 5 days. 30 tablet Carlisle Beers, FNP   nirmatrelvir/ritonavir (PAXLOVID, 300/100,) 20 x 150 MG & 10 x 100MG  TBPK Take 3 tablets by mouth 2 (two) times daily for 5 days. Patient GFR is greater than 60. Take nirmatrelvir (150 mg) two tablets twice daily for 5 days and ritonavir (100 mg) one tablet twice daily for 5 days. 30 tablet Carlisle Beers, FNP      PDMP not reviewed this encounter.   Carlisle Beers, [place] 06/21/23 1052

## 2023-06-21 NOTE — Discharge Instructions (Signed)
You have COVID-19 viral illness. Wear a mask for 5 days of symptoms. You may return to public within those 5 days as long as you do not have a fever. Wash hands frequently.  Take paxlovid as prescribed. Start medicine tonight. The sooner the paxlovid is started, the better it works in Public relations account executive.  - Take prescribed medicines to help with symptoms: tessalon perles, promethazine DM (drowsiness precautions with cough syrup, only take at bedtime) - Use over the counter medicines to help with symptoms as discussed: Tylenol, guaifenesin (mucinex), zyrtec, etc - Two teaspoons of honey in warm water every 4-6 hours may help with throat pains - Humidifier in your room at night to help add water the air and soothe cough  If you develop any new or worsening symptoms or do not improve in the next 2 to 3 days, please return.  If your symptoms are severe, please go to the emergency room.  Follow-up with PCP as needed.

## 2023-07-15 ENCOUNTER — Emergency Department (HOSPITAL_COMMUNITY)
Admission: EM | Admit: 2023-07-15 | Discharge: 2023-07-15 | Disposition: A | Discharge status: Home / Self Care | Payer: No Typology Code available for payment source

## 2023-07-15 ENCOUNTER — Encounter (HOSPITAL_COMMUNITY): Payer: Self-pay

## 2023-07-15 ENCOUNTER — Other Ambulatory Visit: Payer: Self-pay

## 2023-07-15 DIAGNOSIS — R000 Tachycardia, unspecified: Secondary | ICD-10-CM | POA: Insufficient documentation

## 2023-07-15 DIAGNOSIS — T6591XA Toxic effect of unspecified substance, accidental (unintentional), initial encounter: Secondary | ICD-10-CM

## 2023-07-15 DIAGNOSIS — T40711A Poisoning by cannabis, accidental (unintentional), initial encounter: Secondary | ICD-10-CM | POA: Insufficient documentation

## 2023-07-15 DIAGNOSIS — R42 Dizziness and giddiness: Secondary | ICD-10-CM | POA: Diagnosis present

## 2023-07-15 LAB — BASIC METABOLIC PANEL
Anion gap: 9 (ref 5–15)
BUN: 18 mg/dL (ref 6–20)
CO2: 26 mmol/L (ref 22–32)
Calcium: 8.9 mg/dL (ref 8.9–10.3)
Chloride: 101 mmol/L (ref 98–111)
Creatinine, Ser: 0.69 mg/dL (ref 0.44–1.00)
GFR, Estimated: >60 mL/min (ref >60)
Glucose, Bld: 148 mg/dL — ABNORMAL HIGH (ref 70–99)
Potassium: 4 mmol/L (ref 3.5–5.1)
Sodium: 136 mmol/L (ref 135–145)

## 2023-07-15 LAB — CBC
HCT: 37.6 % (ref 36.0–46.0)
Hemoglobin: 12 g/dL (ref 12.0–15.0)
MCH: 28 pg (ref 26.0–34.0)
MCHC: 31.9 g/dL (ref 30.0–36.0)
MCV: 87.9 fL (ref 80.0–100.0)
Platelets: 250 10*3/uL (ref 150–400)
RBC: 4.28 MIL/uL (ref 3.87–5.11)
RDW: 14.7 % (ref 11.5–15.5)
WBC: 7.7 10*3/uL (ref 4.0–10.5)
nRBC: 0 % (ref 0.0–0.2)

## 2023-07-15 MED ORDER — SODIUM CHLORIDE 0.9 % IV BOLUS: 1000.0000 mL | Freq: Once | INTRAVENOUS | Status: DC

## 2023-07-15 MED ORDER — METOCLOPRAMIDE HCL 10 MG PO TABS
10.0000 mg | ORAL_TABLET | Freq: Once | ORAL | Status: AC
  Administered 2023-07-15: 10 mg via ORAL
  Filled 2023-07-15: qty 1

## 2023-07-15 MED ORDER — ACETAMINOPHEN 500 MG PO TABS
1000.0000 mg | ORAL_TABLET | Freq: Once | ORAL | Status: AC
  Administered 2023-07-15: 1000 mg via ORAL
  Filled 2023-07-15: qty 2

## 2023-07-15 NOTE — ED Triage Notes (Addendum)
Patient BIB EMS for dizziness and nausea. 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 Patient hit medical alert button. Patient ate delta 8 brownie at 1300. Dizziness started shortly after.

## 2023-07-15 NOTE — ED Provider Notes (Signed)
[place] [facility] AT [facility] Provider Note   CSN: [id number] Arrival date & time: 07/15/23  1525     History  Chief Complaint  Patient presents with   Ingestion    Sara Arias is a 57 y.o. female.  57year-old female present emergency department with lightheadedness and headache.  Patient reports decreased p.o. intake today, ate a THC brownie and then shortly thereafter experienced lightheadedness and headache.  Reports that she has a history of headaches near daily for quite some time.  Frontal location.  Reports that her headache today similar nature and character to prior.  Lightheadedness has resolved.  She is not having any chest pain or palpitations.  Nausea, but no vomiting.  Also reporting history of elevated blood pressure.   Ingestion       Home Medications Prior to Admission medications   Medication Sig Start Date End Date Taking? Authorizing Provider  acetaminophen (TYLENOL) 500 MG tablet Take 1 tablet (500 mg total) by mouth every 6 (six) hours as needed. 01/30/22   Rising, Lurena Joiner, PA-C  albuterol (VENTOLIN HFA) 108 (90 Base) MCG/ACT inhaler Inhale 1-2 puffs into the lungs every 6 (six) hours as needed for wheezing or shortness of breath. 03/01/23   Crain, Whitney L, PA  benzonatate (TESSALON) 100 MG capsule Take 1 capsule (100 mg total) by mouth every 8 (eight) hours. 06/21/23   Carlisle Beers, FNP  cetirizine (ZYRTEC ALLERGY) 10 MG tablet Take 1 tablet (10 mg total) by mouth daily. 11/15/22 03/01/23  Garrison, [place] N, FNP  montelukast (SINGULAIR) 10 MG tablet Take 1 tablet (10 mg total) by mouth at bedtime. 03/01/23   Crain, Whitney L, PA  predniSONE (STERAPRED UNI-PAK 21 TAB) 10 MG (21) TBPK tablet Take by mouth daily. Take 6 tabs by mouth daily  for 1 days, then 5 tabs for 1 days, then 4 tabs for 1 days, then 3 tabs for 1 days, 2 tabs for 1 days, then 1 tab by mouth daily for 1 days 03/01/23   Verlon Setting, Whitney L, PA   promethazine-dextromethorphan (PROMETHAZINE-DM) 6.25-15 MG/5ML syrup Take 5 mLs by mouth at bedtime as needed for cough. 06/21/23   Carlisle Beers, FNP      Allergies    Ibuprofen    Review of Systems   Review of Systems  Physical Exam Updated Vital Signs BP (!) 148/121   Pulse 98   Temp 98 F (36.7 C) (Oral)   Resp 18   Ht 5\' 4"  (1.626 m)   Wt 85.7 kg   SpO2 97%   BMI 32.43 kg/m  Physical Exam Vitals and nursing note reviewed.  Constitutional:      General: She is not in acute distress.    Appearance: She is obese. She is not toxic-appearing.  HENT:     Head: Normocephalic and atraumatic.     Nose: Nose normal.     Mouth/Throat:     Mouth: Mucous membranes are moist.  Eyes:     Pupils: Pupils are equal, round, and reactive to light.  Cardiovascular:     Rate and Rhythm: Regular rhythm. Tachycardia present.  Pulmonary:     Effort: Pulmonary effort is normal.     Breath sounds: Normal breath sounds.  Abdominal:     General: Abdomen is flat. There is no distension.     Palpations: Abdomen is soft.     Tenderness: There is no abdominal tenderness. There is no guarding or rebound.  Musculoskeletal:  General: Normal range of motion.  Skin:    General: Skin is warm.     Capillary Refill: Capillary refill takes less than 2 seconds.  Neurological:     General: No focal deficit present.     Mental Status: She is alert and oriented to person, place, and time.     Cranial Nerves: No cranial nerve deficit.     Sensory: No sensory deficit.     Motor: No weakness.     Coordination: Coordination normal.  Psychiatric:        Mood and Affect: Mood normal.        Behavior: Behavior normal.     ED Results / Procedures / Treatments   Labs (all labs ordered are listed, but only abnormal results are displayed) Labs Reviewed  BASIC METABOLIC PANEL - Abnormal; Notable for the following components:      Result Value   Glucose, Bld 148 (*)    All other  components within normal limits  CBC    EKG EKG Interpretation Date/Time:  Thursday July 15 2023 15:34:58 EST Ventricular Rate:  99 PR Interval:  131 QRS Duration:  76 QT Interval:  343 QTC Calculation: 441 R Axis:   47  Text Interpretation: Sinus rhythm LAE, consider biatrial enlargement Borderline repolarization abnormality Confirmed by Estanislado Pandy [phone]) on 07/15/2023 3:58:03 PM  Radiology No results found.  Procedures Procedures    Medications Ordered in ED Medications  acetaminophen (TYLENOL) tablet 1,000 mg (1,000 mg Oral Given 07/15/23 1704)  metoCLOPramide (REGLAN) tablet 10 mg (10 mg Oral Given 07/15/23 1704)    ED Course/ Medical Decision Making/ A&P Clinical Course as of 07/15/23 2349  Thu Jul 15, 2023  1820 Heart rate has improved after oral rehydration; this would suggest elevation secondary to hypovolemia/decreased p.o. intake as patient reports.  Infection less likely.  Labs independently reviewed by myself with no leukocytosis to suggest systemic infection.  Patient metabolic panel with no significant metabolic derangements.  Normal kidney function.  Patient headache is nearly gone.  She continues to have no symptoms and would like to leave. Counseled on avoiding THC in the future. Stable for discharge with PCP followup.  [TY]    Clinical Course User Index [TY] Coral Spikes, DO                                 Medical Decision Making Is a 57 year old female presenting emergency department for lightheadedness and headache after eating delta 8 brownie.  Hypertensive, tachycardic.  Afebrile.  Physical exam does not appear to be in distress, no localizing neurodeficits.  Given vital signs will get basic labs.  I suspect elevated heart rate multifactorial possibly secondary to drugs versus decreased p.o. which patient is reporting today.  Will give IV fluids.  Elevated blood pressure, appears to be chronic as per chart review of last 6 emergency department  encounters have had blood pressures in the 160s-170s.  Her headaches seemingly chronic and unchanged.  Low suspicion for acute intracranial process at this time.  Will reevaluate.  See ED course for final MDM disposition.    Amount and/or Complexity of Data Reviewed Labs: ordered.  Risk OTC drugs. Prescription drug management.          Final Clinical Impression(s) / ED Diagnoses Final diagnoses:  Ingestion of substance, accidental or unintentional, initial encounter    Rx / DC Orders ED Discharge Orders  None         Coral Spikes, DO 07/15/23 2349

## 2023-07-15 NOTE — ED Notes (Signed)
Patient sitting on side of bed drinking water.

## 2023-07-15 NOTE — Discharge Instructions (Signed)
Please department after.  Please do not use THC/delta 8 given.  Return immediately for fevers, chills, sudden onset headache, chest pain, shortness of breath, palpitations, lightheadedness, pass out or you develop any new or worsening symptoms that are concerning to you.

## 2023-07-20 ENCOUNTER — Other Ambulatory Visit: Payer: No Typology Code available for payment source

## 2023-10-04 ENCOUNTER — Ambulatory Visit (HOSPITAL_COMMUNITY)
Admission: EM | Admit: 2023-10-04 | Discharge: 2023-10-04 | Disposition: A | Discharge status: Home / Self Care | Payer: No Typology Code available for payment source | Attending: Physician Assistant | Admitting: Physician Assistant

## 2023-10-04 ENCOUNTER — Encounter (HOSPITAL_COMMUNITY): Payer: Self-pay

## 2023-10-04 DIAGNOSIS — J069 Acute upper respiratory infection, unspecified: Secondary | ICD-10-CM | POA: Diagnosis present

## 2023-10-04 LAB — POCT RAPID STREP A (OFFICE): Rapid Strep A Screen: NEGATIVE

## 2023-10-04 MED ORDER — PREDNISONE 20 MG PO TABS: ORAL_TABLET | ORAL | 0 refills | Status: DC

## 2023-10-04 NOTE — ED Triage Notes (Signed)
Patient presents to the office fever,diarrhea and cough x 2 days.

## 2023-10-04 NOTE — Discharge Instructions (Addendum)
Your strep testing was negative. We have sent off a culture for a definitive rule out. At this time I recommend treating your symptoms as we would treat a viral upper respiratory illness or "cold"   Based on your described symptoms and the duration of symptoms it is likely that you have a viral upper respiratory infection (often called a "cold")  Symptoms can last for 3-10 days with lingering cough and intermittent symptoms lasting weeks after that.  The goal of treatment at this time is to reduce your symptoms and discomfort   I recommend using Robitussin and Mucinex (regular formulations, nothing with decongestants or DM)  You can also use Tylenol for body aches and fever reduction I also recommend adding an antihistamine to your daily regimen This includes medications like Claritin, Allegra, Zyrtec- the generics of these work very well and are usually less expensive I recommend using Flonase nasal spray - 2 puffs twice per day to help with your nasal congestion The antihistamines and Flonase can take a few weeks to provide significant relief from allergy symptoms but should start to provide some benefit soon. You can use a humidifier at night to help with preventing nasal dryness and irritation  I have sent in a script for Prednisone taper to be taken in the morning with breakfast per the instructions on the container Remember that steroids can cause sleeplessness, irritability, increased hunger and elevated glucose levels so be mindful of these side effects. They should lessen as you progress to the lower doses of the taper.  If your symptoms do not improve or become worse in the next 5-7 days please make an apt at the office so we can see you  Go to the ER if you begin to have more serious symptoms such as shortness of breath, trouble breathing, loss of consciousness, swelling around the eyes, high fever, severe lasting headaches, vision changes or neck pain/stiffness.

## 2023-10-04 NOTE — ED Provider Notes (Signed)
[facility]    CSN: [id number] Arrival date & time: 10/04/23  1042      History   Chief Complaint No chief complaint on file.   HPI Sara Arias is a 58 y.o. female.   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   Past Surgical History:  Procedure Laterality Date   PARTIAL HYSTERECTOMY     SHOULDER SURGERY      OB History   No obstetric history on file.      Home Medications    Prior to Admission medications   Medication Sig Start Date End Date Taking? Authorizing Provider  predniSONE (DELTASONE) 20 MG tablet Take 60mg  PO daily x 2 days, then40mg  PO daily x 2 days, then 20mg  PO daily x 3 days 10/04/23  Yes Lemon Sternberg E, PA-C  acetaminophen (TYLENOL) 500 MG tablet Take 1 tablet (500 mg total) by mouth every 6 (six) hours as needed. 01/30/22   Rising, Lurena Joiner, PA-C  albuterol (VENTOLIN HFA) 108 (90 Base) MCG/ACT inhaler Inhale 1-2 puffs into the lungs every 6 (six) hours as needed for wheezing or shortness of breath. 03/01/23   Crain, Whitney L, PA  benzonatate (TESSALON) 100 MG capsule Take 1 capsule (100 mg total) by mouth every 8 (eight) hours. 06/21/23   Carlisle Beers, FNP  cetirizine (ZYRTEC ALLERGY) 10 MG tablet Take 1 tablet (10 mg total) by mouth daily. 11/15/22 03/01/23  Garrison, [place] N, FNP  montelukast (SINGULAIR) 10 MG tablet Take 1 tablet (10 mg total) by mouth at bedtime. 03/01/23   Crain, Whitney L, PA  promethazine-dextromethorphan (PROMETHAZINE-DM) 6.25-15 MG/5ML syrup  Take 5 mLs by mouth at bedtime as needed for cough. 06/21/23   Carlisle Beers, FNP    Family History Family History  Problem Relation Age of Onset   Osteoarthritis Mother    Hypertension Father    Hypertension Sister     Social History Social History   Tobacco Use   Smoking status: Former    Types: Cigarettes   Smokeless tobacco: Never  Vaping Use   Vaping status: Never Used  Substance Use Topics   Alcohol use: No   Drug use: No     Allergies   Ibuprofen   Review of Systems Review of Systems  Constitutional:  Positive for chills, fatigue and fever.  HENT:  Positive for sneezing and sore throat. Negative for congestion, ear pain, rhinorrhea, sinus pressure and sinus pain.   Respiratory:  Positive for cough. Negative for shortness of breath and wheezing.   Gastrointestinal:  Positive for diarrhea. Negative for nausea and vomiting.  Musculoskeletal:  Positive for myalgias.  Neurological:  Positive for headaches.     Physical Exam Triage Vital Signs ED Triage Vitals [10/04/23 1304]  Encounter Vitals Group     BP (!) 138/127     Systolic BP Percentile      Diastolic BP Percentile  Pulse Rate 94     Resp 16     Temp 98.3 F (36.8 C)     Temp Source Oral     SpO2 98 %     Weight      Height      Head Circumference      Peak Flow      Pain Score      Pain Loc      Pain Education      Exclude from Growth Chart    No data found.  Updated Vital Signs BP (!) 138/127 (BP Location: Left Arm)   Pulse 94   Temp 98.3 F (36.8 C) (Oral)   Resp 16   SpO2 98%   Visual Acuity Right Eye Distance:   Left Eye Distance:   Bilateral Distance:    Right Eye Near:   Left Eye Near:    Bilateral Near:     Physical Exam Vitals reviewed.  Constitutional:      General: She is awake.     Appearance: She is well-developed and well-groomed. She is ill-appearing and diaphoretic.  HENT:     Head: Normocephalic and atraumatic.     Right Ear: Hearing,  tympanic membrane and ear canal normal.     Left Ear: Hearing, tympanic membrane and ear canal normal.     Mouth/Throat:     Lips: Pink.     Mouth: Mucous membranes are moist.     Dentition: Dental abscesses present.     Pharynx: Uvula midline. Posterior oropharyngeal erythema present.     Tonsils: Tonsillar exudate present. 1+ on the right.  Cardiovascular:     Rate and Rhythm: Normal rate and regular rhythm.     Heart sounds: Normal heart sounds.  Pulmonary:     Effort: Pulmonary effort is normal.     Breath sounds: Decreased air movement present. Decreased breath sounds present. No wheezing, rhonchi or rales.  Lymphadenopathy:     Head:     Right side of head: No submental, submandibular or preauricular adenopathy.     Left side of head: No submental, submandibular or preauricular adenopathy.     Cervical:     Right cervical: No superficial cervical adenopathy.    Left cervical: No superficial cervical adenopathy.     Upper Body:     Right upper body: No supraclavicular adenopathy.     Left upper body: No supraclavicular adenopathy.  Neurological:     Mental Status: She is alert.  Psychiatric:        Behavior: Behavior is cooperative.      UC Treatments / Results  Labs (all labs ordered are listed, but only abnormal results are displayed) Labs Reviewed  SARS CORONAVIRUS 2 (TAT 6-24 HRS)  POCT RAPID STREP A (OFFICE)    EKG   Radiology No results found.  Procedures Procedures (including critical care time)  Medications Ordered in UC Medications - No data to display  Initial Impression / Assessment and Plan / UC Course  I have reviewed the triage vital signs and the nursing notes.  Pertinent labs & imaging results that were available during my care of the patient were reviewed by me and considered in my medical decision making (see chart for details).      Final Clinical Impressions(s) / UC Diagnoses   Final diagnoses:  Acute upper respiratory infection    Acute, new concern She reports ongoing chills, fatigue, fever, body aches, diarrhea for 2 days and states multiple people at  her job have been ill as well Currently we do not have access to POC flu or COVID testing. Will get send out COVID and rapid strep. Rapid strep negative. Will get strep culture for definitive rule out. Will send in script for Prednisone taper to assist with decreased lung sounds and tonsilar swelling. Recommend OTC medications for symptomatic management. Recommend using HTN conscious medication formulations to assist with management and following up with PCP for elevated BP. ED and return precautions reviewed.  Follow up as needed for persistent or progressing symptoms     Discharge Instructions      Your strep testing was negative. We have sent off a culture for a definitive rule out. At this time I recommend treating your symptoms as we would treat a viral upper respiratory illness or "cold"   Based on your described symptoms and the duration of symptoms it is likely that you have a viral upper respiratory infection (often called a "cold")  Symptoms can last for 3-10 days with lingering cough and intermittent symptoms lasting weeks after that.  The goal of treatment at this time is to reduce your symptoms and discomfort   I recommend using Robitussin and Mucinex (regular formulations, nothing with decongestants or DM)  You can also use Tylenol for body aches and fever reduction I also recommend adding an antihistamine to your daily regimen This includes medications like Claritin, Allegra, Zyrtec- the generics of these work very well and are usually less expensive I recommend using Flonase nasal spray - 2 puffs twice per day to help with your nasal congestion The antihistamines and Flonase can take a few weeks to provide significant relief from allergy symptoms but should start to provide some benefit soon. You can use a humidifier at night to help with preventing  nasal dryness and irritation  I have sent in a script for Prednisone taper to be taken in the morning with breakfast per the instructions on the container Remember that steroids can cause sleeplessness, irritability, increased hunger and elevated glucose levels so be mindful of these side effects. They should lessen as you progress to the lower doses of the taper.  If your symptoms do not improve or become worse in the next 5-7 days please make an apt at the office so we can see you  Go to the ER if you begin to have more serious symptoms such as shortness of breath, trouble breathing, loss of consciousness, swelling around the eyes, high fever, severe lasting headaches, vision changes or neck pain/stiffness.       ED Prescriptions     Medication Sig Dispense Auth. Provider   predniSONE (DELTASONE) 20 MG tablet Take 60mg  PO daily x 2 days, then40mg  PO daily x 2 days, then 20mg  PO daily x 3 days 13 tablet Dashanae Longfield E, PA-C      PDMP not reviewed this encounter.   Providence Crosby, PA-C 10/04/23 1437

## 2023-10-05 LAB — SARS CORONAVIRUS 2 (TAT 6-24 HRS): SARS Coronavirus 2: POSITIVE — AB

## 2023-12-19 ENCOUNTER — Encounter (HOSPITAL_COMMUNITY): Payer: Self-pay | Admitting: Emergency Medicine

## 2023-12-19 ENCOUNTER — Ambulatory Visit (HOSPITAL_COMMUNITY)
Admission: EM | Admit: 2023-12-19 | Discharge: 2023-12-19 | Disposition: A | Discharge status: Home / Self Care | Attending: Physician Assistant | Admitting: Physician Assistant

## 2023-12-19 DIAGNOSIS — M25511 Pain in right shoulder: Secondary | ICD-10-CM | POA: Diagnosis not present

## 2023-12-19 DIAGNOSIS — G8929 Other chronic pain: Secondary | ICD-10-CM

## 2023-12-19 DIAGNOSIS — M25512 Pain in left shoulder: Secondary | ICD-10-CM | POA: Diagnosis not present

## 2023-12-19 MED ORDER — CYCLOBENZAPRINE HCL 10 MG PO TABS: 10.0000 mg | ORAL_TABLET | Freq: Two times a day (BID) | ORAL | 0 refills | Status: DC | PRN

## 2023-12-19 MED ORDER — PREDNISONE 20 MG PO TABS: 40.0000 mg | ORAL_TABLET | Freq: Every day | ORAL | 0 refills | Status: AC

## 2023-12-19 NOTE — ED Triage Notes (Signed)
 Pt reports had bilateral shoulder pain "for a while". 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 Ever since having left shoulder pain even more. Reports that she is supposed to have surgery on left shoulder but currently out of PCP and supposed to be seeing on e next month. Tried Icy hot and another cream

## 2023-12-19 NOTE — ED Provider Notes (Signed)
 [facility]    CSN: [id number] Arrival date & time: 12/19/23  1528      History   Chief Complaint Chief Complaint  Patient presents with   Shoulder Pain    HPI Sara Arias is a 58 y.o. female.   Patient presents today for evaluation of bilateral shoulder pain.  She has chronic issues with her shoulders but states last week she tried to help a resident that was falling and has had more left shoulder pain.  She states since this occurred she has been using her right shoulder more causing more issues there as well.  She does not currently have a primary care but is set to establish care next month.  She has tried IcyHot and other topical treatment without resolution of symptoms.  She notes in the past she has been told that she needed surgery for her left shoulder.  The history is provided by the patient.  Shoulder Pain Associated symptoms: no fever     Past Medical History:  Diagnosis Date   Knee pain    Obesity    Panic attacks     There are no active problems to display for this patient.   Past Surgical History:  Procedure Laterality Date   PARTIAL HYSTERECTOMY     SHOULDER SURGERY      OB History   No obstetric history on file.      Home Medications    Prior to Admission medications   Medication Sig Start Date End Date Taking? Authorizing Provider  cyclobenzaprine  (FLEXERIL ) 10 MG tablet Take 1 tablet (10 mg total) by mouth 2 (two) times daily as needed for muscle spasms. 12/19/23  Yes Vernestine Gondola, PA-C  predniSONE  (DELTASONE ) 20 MG tablet Take 2 tablets (40 mg total) by mouth daily with breakfast for 5 days. 12/19/23 12/24/23 Yes Vernestine Gondola, PA-C  albuterol  (VENTOLIN  HFA) 108 (90 Base) MCG/ACT inhaler Inhale 1-2 puffs into the lungs every 6 (six) hours as needed for wheezing or shortness of breath. 03/01/23   Crain, Whitney L, PA  cetirizine  (ZYRTEC  ALLERGY) 10 MG tablet Take 1 tablet (10 mg total) by mouth daily. 11/15/22 03/01/23  Harlow Lighter,  [place]  N, FNP  montelukast  (SINGULAIR ) 10 MG tablet Take 1 tablet (10 mg total) by mouth at bedtime. 03/01/23   Mandy Second, PA    Family History Family History  Problem Relation Age of Onset   Osteoarthritis Mother    Hypertension Father    Hypertension Sister     Social History Social History   Tobacco Use   Smoking status: Former    Types: Cigarettes   Smokeless tobacco: Never  Vaping Use   Vaping status: Never Used  Substance Use Topics   Alcohol use: No   Drug use: No     Allergies   Ibuprofen   Review of Systems Review of Systems  Constitutional:  Negative for chills and fever.  Eyes:  Negative for discharge and redness.  Gastrointestinal:  Negative for abdominal pain, nausea and vomiting.  Musculoskeletal:  Positive for arthralgias and myalgias.  Neurological:  Negative for numbness.     Physical Exam Triage Vital Signs ED Triage Vitals  Encounter Vitals Group     BP 12/19/23 1608 (!) 153/88     Systolic BP Percentile --      Diastolic BP Percentile --      Pulse Rate 12/19/23 1608 88     Resp 12/19/23 1608 18     Temp  12/19/23 1608 97.8 F (36.6 C)     Temp Source 12/19/23 1608 Oral     SpO2 12/19/23 1608 97 %     Weight --      Height --      Head Circumference --      Peak Flow --      Pain Score 12/19/23 1607 10     Pain Loc --      Pain Education --      Exclude from Growth Chart --    No data found.  Updated Vital Signs BP (!) 153/88 (BP Location: Left Arm)   Pulse 88   Temp 97.8 F (36.6 C) (Oral)   Resp 18   SpO2 97%   Visual Acuity Right Eye Distance:   Left Eye Distance:   Bilateral Distance:    Right Eye Near:   Left Eye Near:    Bilateral Near:     Physical Exam Vitals and nursing note reviewed.  Constitutional:      General: She is not in acute distress.    Appearance: Normal appearance. She is not ill-appearing.  HENT:     Head: Normocephalic and atraumatic.  Eyes:     Conjunctiva/sclera: Conjunctivae  normal.  Cardiovascular:     Rate and Rhythm: Normal rate.  Pulmonary:     Effort: Pulmonary effort is normal. No respiratory distress.  Musculoskeletal:     Comments: Normal range of motion of bilateral arms and shoulders.  Mild tenderness noted diffusely to bilateral shoulders posteriorly.  Tenderness also noted to bilateral trapezius area.  Neurological:     Mental Status: She is alert.     Comments: Grip strength 5 out of 5 bilaterally  Psychiatric:        Mood and Affect: Mood normal.        Behavior: Behavior normal.        Thought Content: Thought content normal.      UC Treatments / Results  Labs (all labs ordered are listed, but only abnormal results are displayed) Labs Reviewed - No data to display  EKG   Radiology No results found.  Procedures Procedures (including critical care time)  Medications Ordered in UC Medications - No data to display  Initial Impression / Assessment and Plan / UC Course  I have reviewed the triage vital signs and the nursing notes.  Pertinent labs & imaging results that were available during my care of the patient were reviewed by me and considered in my medical decision making (see chart for details).    Steroid burst and muscle relaxer prescribed for reported acute on chronic shoulder pain.  Advise follow-up with primary care as planned as I suspect she will need follow-up with Ortho as well.  Recommended sooner follow-up with any concerns.  Advised muscle relaxer may cause drowsiness and use with caution.  Final Clinical Impressions(s) / UC Diagnoses   Final diagnoses:  Chronic pain of both shoulders   Discharge Instructions   None    ED Prescriptions     Medication Sig Dispense Auth. Provider   predniSONE  (DELTASONE ) 20 MG tablet Take 2 tablets (40 mg total) by mouth daily with breakfast for 5 days. 10 tablet Jami Mcclintock F, PA-C   cyclobenzaprine  (FLEXERIL ) 10 MG tablet Take 1 tablet (10 mg total) by mouth 2 (two)  times daily as needed for muscle spasms. 20 tablet Vernestine Gondola, PA-C      PDMP not reviewed this encounter.   Vernestine Gondola,  PA-C 12/19/23 1711

## 2024-01-05 ENCOUNTER — Encounter (HOSPITAL_COMMUNITY): Payer: Self-pay

## 2024-01-12 ENCOUNTER — Ambulatory Visit: Payer: Self-pay | Admitting: Family Medicine

## 2024-01-12 ENCOUNTER — Telehealth: Payer: Self-pay | Admitting: Family Medicine

## 2024-01-12 ENCOUNTER — Encounter: Payer: Self-pay | Admitting: Family Medicine

## 2024-01-12 ENCOUNTER — Ambulatory Visit (INDEPENDENT_AMBULATORY_CARE_PROVIDER_SITE_OTHER): Admitting: Family Medicine

## 2024-01-12 VITALS — BP 150/90 | HR 89 | Temp 98.7°F | Ht 64.0 in | Wt 232.8 lb

## 2024-01-12 DIAGNOSIS — I10 Essential (primary) hypertension: Secondary | ICD-10-CM | POA: Diagnosis not present

## 2024-01-12 DIAGNOSIS — E66812 Obesity, class 2: Secondary | ICD-10-CM

## 2024-01-12 DIAGNOSIS — Z136 Encounter for screening for cardiovascular disorders: Secondary | ICD-10-CM

## 2024-01-12 DIAGNOSIS — Z111 Encounter for screening for respiratory tuberculosis: Secondary | ICD-10-CM

## 2024-01-12 DIAGNOSIS — Z6839 Body mass index (BMI) 39.0-39.9, adult: Secondary | ICD-10-CM | POA: Diagnosis not present

## 2024-01-12 DIAGNOSIS — M25512 Pain in left shoulder: Secondary | ICD-10-CM

## 2024-01-12 DIAGNOSIS — G8929 Other chronic pain: Secondary | ICD-10-CM

## 2024-01-12 LAB — CBC WITH DIFFERENTIAL/PLATELET
Basophils Absolute: 0.1 10*3/uL (ref 0.0–0.1)
Basophils Relative: 1.3 % (ref 0.0–3.0)
Eosinophils Absolute: 0.1 10*3/uL (ref 0.0–0.7)
Eosinophils Relative: 1.6 % (ref 0.0–5.0)
HCT: 39.5 % (ref 36.0–46.0)
Hemoglobin: 12.8 g/dL (ref 12.0–15.0)
Lymphocytes Relative: 37.2 % (ref 12.0–46.0)
Lymphs Abs: 2.3 10*3/uL (ref 0.7–4.0)
MCHC: 32.5 g/dL (ref 30.0–36.0)
MCV: 87.7 fl (ref 78.0–100.0)
Monocytes Absolute: 0.3 10*3/uL (ref 0.1–1.0)
Monocytes Relative: 5 % (ref 3.0–12.0)
Neutro Abs: 3.4 10*3/uL (ref 1.4–7.7)
Neutrophils Relative %: 54.9 % (ref 43.0–77.0)
Platelets: 296 10*3/uL (ref 150.0–400.0)
RBC: 4.5 Mil/uL (ref 3.87–5.11)
RDW: 14.9 % (ref 11.5–15.5)
WBC: 6.1 10*3/uL (ref 4.0–10.5)

## 2024-01-12 LAB — COMPREHENSIVE METABOLIC PANEL WITH GFR
ALT: 15 U/L (ref 0–35)
AST: 15 U/L (ref 0–37)
Albumin: 4.2 g/dL (ref 3.5–5.2)
Alkaline Phosphatase: 56 U/L (ref 39–117)
BUN: 10 mg/dL (ref 6–23)
CO2: 29 meq/L (ref 19–32)
Calcium: 9.7 mg/dL (ref 8.4–10.5)
Chloride: 104 meq/L (ref 96–112)
Creatinine, Ser: 0.69 mg/dL (ref 0.40–1.20)
GFR: 95.01 mL/min (ref >60.00)
Glucose, Bld: 99 mg/dL (ref 70–99)
Potassium: 4 meq/L (ref 3.5–5.1)
Sodium: 140 meq/L (ref 135–145)
Total Bilirubin: 0.3 mg/dL (ref 0.2–1.2)
Total Protein: 7.5 g/dL (ref 6.0–8.3)

## 2024-01-12 LAB — LIPID PANEL
Cholesterol: 275 mg/dL — ABNORMAL HIGH (ref 0–200)
HDL: 58.9 mg/dL (ref >39.00)
LDL Cholesterol: 193 mg/dL — ABNORMAL HIGH (ref 0–99)
NonHDL: 215.84
Total CHOL/HDL Ratio: 5
Triglycerides: 115 mg/dL (ref 0.0–149.0)
VLDL: 23 mg/dL (ref 0.0–40.0)

## 2024-01-12 MED ORDER — HYDROCHLOROTHIAZIDE 25 MG PO TABS: 25.0000 mg | ORAL_TABLET | Freq: Every day | ORAL | 3 refills | Status: AC

## 2024-01-12 MED ORDER — ALBUTEROL SULFATE HFA 108 (90 BASE) MCG/ACT IN AERS: 1.0000 | INHALATION_SPRAY | Freq: Four times a day (QID) | RESPIRATORY_TRACT | 0 refills | Status: DC | PRN

## 2024-01-12 MED ORDER — MONTELUKAST SODIUM 10 MG PO TABS: 10.0000 mg | ORAL_TABLET | Freq: Every day | ORAL | 0 refills | Status: DC

## 2024-01-12 NOTE — Progress Notes (Signed)
 New Patient Office Visit  Subjective    Patient ID: Sara Arias, female    DOB: 1966/04/24  Age: 58 y.o. MRN: [id number]  CC: No chief complaint on file.   HPI Sara Arias presents to establish care today. Reports that she is concerned about her blood pressures.  States that they have been high for "a while." Has not previously taken antihypertensives. Does endorse bilateral lower extremity swelling. Denies HA, vision changes, chest pain, palpitations, SOB. Has ability to check BP at work. Reports compliance with medication regimen.  Denies other concerns today. Requesting Tb screen for work today. Denies previous positive Tb screen, denies known Tb exposure.  Chronic L shoulder pain. Cortisone injection has helped in the past.   Outpatient Encounter Medications as of 01/12/2024  Medication Sig   cetirizine  (ZYRTEC  ALLERGY) 10 MG tablet Take 1 tablet (10 mg total) by mouth daily.   cyclobenzaprine  (FLEXERIL ) 10 MG tablet Take 1 tablet (10 mg total) by mouth 2 (two) times daily as needed for muscle spasms.   hydrochlorothiazide  (HYDRODIURIL ) 25 MG tablet Take 1 tablet (25 mg total) by mouth daily.   [DISCONTINUED] albuterol  (VENTOLIN  HFA) 108 (90 Base) MCG/ACT inhaler Inhale 1-2 puffs into the lungs every 6 (six) hours as needed for wheezing or shortness of breath.   [DISCONTINUED] montelukast  (SINGULAIR ) 10 MG tablet Take 1 tablet (10 mg total) by mouth at bedtime.   albuterol  (VENTOLIN  HFA) 108 (90 Base) MCG/ACT inhaler Inhale 1-2 puffs into the lungs every 6 (six) hours as needed for wheezing or shortness of breath.   montelukast  (SINGULAIR ) 10 MG tablet Take 1 tablet (10 mg total) by mouth at bedtime.   No facility-administered encounter medications on file as of 01/12/2024.    Past Medical History:  Diagnosis Date   Knee pain    Obesity    Panic attacks     Past Surgical History:  Procedure Laterality Date   PARTIAL HYSTERECTOMY     SHOULDER SURGERY       Family History  Problem Relation Age of Onset   Osteoarthritis Mother    Hypertension Father    Hypertension Sister     Social History   Socioeconomic History   Marital status: Divorced    Spouse name: Not on file   Number of children: Not on file   Years of education: Not on file   Highest education level: Not on file  Occupational History   Not on file  Tobacco Use   Smoking status: Former    Types: Cigarettes   Smokeless tobacco: Never  Vaping Use   Vaping status: Never Used  Substance and Sexual Activity   Alcohol use: No   Drug use: No   Sexual activity: Not Currently    Birth control/protection: Surgical  Other Topics Concern   Not on file  Social History Narrative   Not on file   Social Drivers of Health   Financial Resource Strain: Not on file  Food Insecurity: Not on file  Transportation Needs: Not on file  Physical Activity: Not on file  Stress: Not on file  Social Connections: Not on file  Intimate Partner Violence: Not on file    ROS Per HPI      Objective    BP (!) 150/90 Comment: Repeat BP  Pulse 89   Temp 98.7 F (37.1 C) (Temporal)   Ht 5\' 4"  (1.626 m)   Wt 232 lb 12.8 oz (105.6 kg)   SpO2 97%  BMI 39.96 kg/m   Physical Exam Vitals and nursing note reviewed.  Constitutional:      General: She is not in acute distress.    Appearance: Normal appearance. She is obese.  HENT:     Head: Normocephalic and atraumatic.     Right Ear: External ear normal.     Left Ear: External ear normal.     Nose: Nose normal.     Mouth/Throat:     Mouth: Mucous membranes are moist.     Pharynx: Oropharynx is clear.  Eyes:     Extraocular Movements: Extraocular movements intact.     Pupils: Pupils are equal, round, and reactive to light.  Neck:     Vascular: No carotid bruit.  Cardiovascular:     Rate and Rhythm: Normal rate and regular rhythm.     Pulses: Normal pulses.     Heart sounds: Normal heart sounds.  Pulmonary:     Effort:  Pulmonary effort is normal. No respiratory distress.     Breath sounds: Normal breath sounds. No wheezing, rhonchi or rales.  Musculoskeletal:        General: Normal range of motion.     Cervical back: Normal range of motion.     Right lower leg: Edema (+2, non pitting) present.     Left lower leg: Edema (+2 non pitting) present.  Lymphadenopathy:     Cervical: No cervical adenopathy.  Neurological:     General: No focal deficit present.     Mental Status: She is alert and oriented to person, place, and time.  Psychiatric:        Mood and Affect: Mood normal.        Thought Content: Thought content normal.         Assessment & Plan:   Primary hypertension Assessment & Plan: START hydrochlorothiazide  25mg  once daily in the morning Check BP at home once daily in the mornings for the next month. Follow up with me in a month to recheck blood pressures.   Orders: -     CBC with Differential/Platelet -     Comprehensive metabolic panel with GFR -     hydroCHLOROthiazide ; Take 1 tablet (25 mg total) by mouth daily.  Dispense: 90 tablet; Refill: 3  Class 2 severe obesity due to excess calories with serious comorbidity and body mass index (BMI) of 39.0 to 39.9 in adult [facility]) Assessment & Plan: Discussed to increase water intake, activity  Orders: -     CBC with Differential/Platelet -     Comprehensive metabolic panel with GFR  Encounter for screening for cardiovascular disorders Assessment & Plan: Lipids today  Orders: -     Lipid panel  Chronic left shoulder pain Assessment & Plan: Ref to Sports Med  Orders: -     Ambulatory referral to Sports Medicine  Screening-pulmonary TB Assessment & Plan: Quant gold today  Orders: -     QuantiFERON-TB Gold Plus  Other orders -     Albuterol  Sulfate HFA; Inhale 1-2 puffs into the lungs every 6 (six) hours as needed for wheezing or shortness of breath.  Dispense: 8 g; Refill: 0 -     Montelukast  Sodium; Take 1 tablet (10  mg total) by mouth at bedtime.  Dispense: 14 tablet; Refill: 0     Return in about 4 weeks (around 02/09/2024) for meds, BP check.   Wellington Half, FNP

## 2024-01-12 NOTE — Patient Instructions (Addendum)
 Welcome to [facility]!  Thank you for choosing us  for your Primary Care needs.   We offer in person and video appointments for your convenience. You may call our office to schedule appointments, or you may schedule appointments with me through MyChart.   The best way to get in contact with me is via MyChart message. This will get to me faster than a phone call, unless there is an emergency, then please call 911.  The lab is located downstairs in the Sports Medicine building, we also have xray available there.   We are checking labs today, will be in contact with any results that require further attention  Check BP at home once daily in the mornings for the next month.   I have sent in hydrochlorothiazide 25mg  once daily in the morning.   Follow up with me in a month to recheck blood pressures.

## 2024-01-12 NOTE — Telephone Encounter (Signed)
 Unable to reach patient. Notified patient via MyChart, all labs are not back yet provider will review labs once they are all back.

## 2024-01-12 NOTE — Telephone Encounter (Signed)
 Copied from CRM [id number]. Topic: Clinical - Lab/Test Results >> Jan 12, 2024 12:41 PM Sara Arias wrote: Reason for CRM: pt called in for lab results, some were abnormal pt requested call back to number on file [phone]

## 2024-01-15 DIAGNOSIS — I10 Essential (primary) hypertension: Secondary | ICD-10-CM | POA: Insufficient documentation

## 2024-01-15 DIAGNOSIS — Z111 Encounter for screening for respiratory tuberculosis: Secondary | ICD-10-CM | POA: Insufficient documentation

## 2024-01-15 DIAGNOSIS — Z136 Encounter for screening for cardiovascular disorders: Secondary | ICD-10-CM | POA: Insufficient documentation

## 2024-01-15 DIAGNOSIS — G8929 Other chronic pain: Secondary | ICD-10-CM | POA: Insufficient documentation

## 2024-01-15 DIAGNOSIS — E66812 Obesity, class 2: Secondary | ICD-10-CM | POA: Insufficient documentation

## 2024-01-15 LAB — QUANTIFERON-TB GOLD PLUS
Mitogen-NIL: 7.74 [IU]/mL
NIL: 0.01 [IU]/mL
QuantiFERON-TB Gold Plus: NEGATIVE
TB1-NIL: 0 [IU]/mL
TB2-NIL: 0 [IU]/mL

## 2024-01-15 NOTE — Assessment & Plan Note (Signed)
Quant gold today

## 2024-01-15 NOTE — Assessment & Plan Note (Signed)
Lipids today

## 2024-01-15 NOTE — Assessment & Plan Note (Signed)
 Ref to Sports Med

## 2024-01-15 NOTE — Assessment & Plan Note (Signed)
 START hydrochlorothiazide  25mg  once daily in the morning Check BP at home once daily in the mornings for the next month. Follow up with me in a month to recheck blood pressures.

## 2024-01-15 NOTE — Assessment & Plan Note (Signed)
 Discussed to increase water intake, activity

## 2024-01-20 ENCOUNTER — Other Ambulatory Visit: Payer: Self-pay

## 2024-01-20 ENCOUNTER — Ambulatory Visit: Admitting: Family Medicine

## 2024-01-20 ENCOUNTER — Encounter: Payer: Self-pay | Admitting: Family Medicine

## 2024-01-20 VITALS — BP 138/84 | HR 99 | Ht 64.0 in | Wt 232.0 lb

## 2024-01-20 DIAGNOSIS — G8929 Other chronic pain: Secondary | ICD-10-CM

## 2024-01-20 DIAGNOSIS — M25512 Pain in left shoulder: Secondary | ICD-10-CM | POA: Diagnosis not present

## 2024-01-20 DIAGNOSIS — M19012 Primary osteoarthritis, left shoulder: Secondary | ICD-10-CM | POA: Diagnosis not present

## 2024-01-20 MED ORDER — METHOCARBAMOL 500 MG PO TABS: 500.0000 mg | ORAL_TABLET | Freq: Three times a day (TID) | ORAL | 3 refills | Status: DC

## 2024-01-20 NOTE — Progress Notes (Signed)
 I, Miquel Amen, CMA acting as a scribe for Garlan Juniper, MD.  Sara Arias is a 58 y.o. female who presents to [facility] at [facility] today for L shoulder pain ongoing since early April. 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 Pt locates pain to lateral aspect of the shoulder. Had eval with [facility] and was told that surgery was needed, unable to proceed d/t work. Sx worse with colder weather. Hx of surgery for left shoulder. Pt LHD.   Radiates: into the upper arm Aggravates: overhead reaching, lifting weighted objects Treatments tried: [employer], prednisone , cyclobenzaprine ,  Dx imaging: 10/29/22 L shoulder MRI 09/07/22 L shoulder XR  Pertinent review of systems: No fevers or chills  Relevant historical information: Hypertension.  Chronic rotator cuff tear left shoulder.   Exam:  BP 138/84   Pulse 99   Ht 5\' 4"  (1.626 m)   Wt 232 lb (105.2 kg)   SpO2 97%   BMI 39.82 kg/m  General: Well Developed, well nourished, and in no acute distress.   MSK: Left shoulder: Normal-appearing Nontender. Range of motion abduction 120 degrees. Strength reduced abduction. Positive Hawkins and Neer's test.    Lab and Radiology Results  Procedure: Real-time Ultrasound Guided Injection of left shoulder glenohumeral joint posterior approach Device: Philips Affiniti 50G/GE Logiq Images permanently stored and available for review in PACS Verbal informed consent obtained.  Discussed risks and benefits of procedure. Warned about infection, bleeding, hyperglycemia damage to structures among others. Patient expresses understanding and agreement Time-out conducted.   Noted no overlying erythema, induration, or other signs of local infection.   Skin prepped in a sterile fashion.   Local anesthesia: Topical Ethyl chloride.   With sterile technique and under real time ultrasound guidance: 40 mg of Kenalog  and 2 mL of Marcaine  injected into glenohumeral  joint. Fluid seen entering the joint capsule.   Completed without difficulty   Pain immediately resolved suggesting accurate placement of the medication.   Advised to call if fevers/chills, erythema, induration, drainage, or persistent bleeding.   Images permanently stored and available for review in the ultrasound unit.  Impression: Technically successful ultrasound guided injection.    EXAM: MRI OF THE LEFT SHOULDER WITHOUT CONTRAST   TECHNIQUE: Multiplanar, multisequence MR imaging of the shoulder was performed. No intravenous contrast was administered.   COMPARISON:  Left radiograph 09/07/2022, left shoulder MRI 2 04/03/2016   FINDINGS: Rotator cuff: Postsurgical changes of prior cuff repair. There is a recurrent full-thickness tear of the supraspinatus tendon and majority of the infraspinatus tendon with retraction nearly to the glenoid. Teres minor is intact. Moderate distal esophagitis tendinosis.   Muscles: There is grade 3 atrophy of the supraspinatus and infraspinatus muscles. No subscapularis atrophy. There is high-grade atrophy of the posterior trapezius muscle. No teres minor atrophy. Streaky low-grade deltoid atrophy.   Biceps Long Head: Prior biceps tenodesis.   Acromioclavicular Joint: Moderate arthropathy of the acromioclavicular joint. Mild subacromial/subdeltoid bursal fluid related to the full-thickness cuff tear.   Glenohumeral Joint: Small joint effusion with synovitis. High-grade and full-thickness cartilage loss. Subchondral marrow edema and cystic change. Bulky inferior osteophyte formation.   Labrum: Diffuse degenerative labral tearing.   Bones: No evidence of acute fracture. No aggressive osseous lesion. Subchondral marrow edema related to arthritis. Subacromial spurring.   Other: No focal fluid collection.   IMPRESSION: Postsurgical changes of prior cuff repair. Recurrent full-thickness tear of the supraspinatus tendon and majority of the  infraspinatus tendon  with retraction nearly to the glenoid. Grade 3 atrophy of the supraspinatus and infraspinatus muscles consistent with chronic tear.   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   Moderate AC joint arthropathy.   Small joint effusion with synovitis.     Electronically Signed   By: Jacob  Kahn M.D.   On: 10/29/2022 14:53 I, Garlan Juniper, personally (independently) visualized and performed the interpretation of the images attached in this note.     Assessment and Plan: 58 y.o. female with left shoulder pain due to chronic rotator cuff tear and severe glenohumeral DJD.  Plan for glenohumeral injection today and physical therapy referral.  Ultimately she will require a total shoulder replacement.  She is thinking towards the end of this year or early next year as time.  To have surgery.  We can do these injections every 3 months if needed.  Methocarbamol  prescribed she has had some benefit with that in the past.   PDMP not reviewed this encounter. Orders Placed This Encounter  Procedures   US  LIMITED JOINT SPACE STRUCTURES UP LEFT(NO LINKED CHARGES)    Reason for Exam (SYMPTOM  OR DIAGNOSIS REQUIRED):   left shoulder pain    Preferred imaging location?:   [facility] referral to Physical Therapy    Referral Priority:   Routine    Referral Type:   Physical Medicine    Referral Reason:   Specialty Services Required    Requested Specialty:   Physical Therapy    Number of Visits Requested:   1   Meds ordered this encounter  Medications   methocarbamol  (ROBAXIN ) 500 MG tablet    Sig: Take 1 tablet (500 mg total) by mouth 3 (three) times daily.    Dispense:  90 tablet    Refill:  3     Discussed warning signs or symptoms. Please see discharge instructions. Patient expresses understanding.   The above documentation has been reviewed and is accurate and complete Garlan Juniper, M.D.

## 2024-01-20 NOTE — Patient Instructions (Addendum)
 Thank you for coming in today.   You received an injection today. Seek immediate medical attention if the joint becomes red, extremely painful, or is oozing fluid.   I've referred you to Physical Therapy.  Let us  know if you don't hear from them in one week.   Schedule back with me in early September

## 2024-02-28 ENCOUNTER — Other Ambulatory Visit: Payer: Self-pay | Admitting: Family Medicine

## 2024-05-02 NOTE — Progress Notes (Unsigned)
   LILLETTE Ileana Collet, PhD, LAT, ATC acting as a scribe for Artist Lloyd, MD.  Sara Arias is a 58 y.o. female who presents to [facility] at [facility] today for f/u L shoulder pain. Pt was last seen by Dr. Lloyd on 01/20/24 and was given a L GH steroid injection and methocarbamol  was prescribed.  Today, pt reports ***  Dx imaging: 10/29/22 L shoulder MRI 09/07/22 L shoulder XR  Pertinent review of systems: ***  Relevant historical information: ***   Exam:  There were no vitals taken for this visit. General: Well Developed, well nourished, and in no acute distress.   MSK: ***    Lab and Radiology Results No results found for this or any previous visit (from the past 72 hours). No results found.     Assessment and Plan: 57 y.o. female with ***   PDMP not reviewed this encounter. No orders of the defined types were placed in this encounter.  No orders of the defined types were placed in this encounter.    Discussed warning signs or symptoms. Please see discharge instructions. Patient expresses understanding.   ***

## 2024-05-03 ENCOUNTER — Other Ambulatory Visit: Payer: Self-pay | Admitting: Family Medicine

## 2024-05-03 ENCOUNTER — Other Ambulatory Visit: Payer: Self-pay

## 2024-05-03 ENCOUNTER — Ambulatory Visit: Admitting: Family Medicine

## 2024-05-03 ENCOUNTER — Ambulatory Visit (INDEPENDENT_AMBULATORY_CARE_PROVIDER_SITE_OTHER)

## 2024-05-03 ENCOUNTER — Encounter: Payer: Self-pay | Admitting: Family Medicine

## 2024-05-03 VITALS — BP 138/86 | HR 83 | Ht 64.0 in | Wt 230.0 lb

## 2024-05-03 DIAGNOSIS — G8929 Other chronic pain: Secondary | ICD-10-CM | POA: Diagnosis not present

## 2024-05-03 DIAGNOSIS — M25512 Pain in left shoulder: Secondary | ICD-10-CM

## 2024-05-03 DIAGNOSIS — M25561 Pain in right knee: Secondary | ICD-10-CM | POA: Diagnosis not present

## 2024-05-03 DIAGNOSIS — M1711 Unilateral primary osteoarthritis, right knee: Secondary | ICD-10-CM

## 2024-05-03 NOTE — Patient Instructions (Addendum)
 Thank you for coming in today.   You received an injection today. Seek immediate medical attention if the joint becomes red, extremely painful, or is oozing fluid.   Please get an Xray today before you leave   I've referred you to Orthopedic Surgery (Dr. Genelle).  Let us  know if you don't hear from them in one week.

## 2024-05-12 ENCOUNTER — Ambulatory Visit: Payer: Self-pay | Admitting: Family Medicine

## 2024-05-12 NOTE — Progress Notes (Signed)
 Right knee x-ray shows worsening arthritis.

## 2024-06-02 ENCOUNTER — Ambulatory Visit (HOSPITAL_BASED_OUTPATIENT_CLINIC_OR_DEPARTMENT_OTHER): Admitting: Orthopaedic Surgery

## 2024-06-08 ENCOUNTER — Ambulatory Visit (HOSPITAL_BASED_OUTPATIENT_CLINIC_OR_DEPARTMENT_OTHER): Admitting: Orthopaedic Surgery

## 2024-06-10 ENCOUNTER — Other Ambulatory Visit: Payer: Self-pay | Admitting: Family Medicine

## 2024-06-12 NOTE — Telephone Encounter (Signed)
 Last OV 05/03/24 Next OV not scheduled  Last refill 01/20/24 Qty #90/3

## 2024-07-03 ENCOUNTER — Encounter: Payer: Self-pay | Admitting: Radiology

## 2024-07-08 ENCOUNTER — Other Ambulatory Visit: Payer: Self-pay | Admitting: Family Medicine

## 2024-07-13 ENCOUNTER — Ambulatory Visit (HOSPITAL_BASED_OUTPATIENT_CLINIC_OR_DEPARTMENT_OTHER): Admitting: Orthopaedic Surgery

## 2024-07-19 ENCOUNTER — Ambulatory Visit: Admitting: Family Medicine

## 2024-07-19 ENCOUNTER — Other Ambulatory Visit: Payer: Self-pay

## 2024-07-19 VITALS — BP 128/84 | HR 101 | Ht 64.0 in | Wt 216.0 lb

## 2024-07-19 DIAGNOSIS — G8929 Other chronic pain: Secondary | ICD-10-CM

## 2024-07-19 DIAGNOSIS — M25512 Pain in left shoulder: Secondary | ICD-10-CM

## 2024-07-19 DIAGNOSIS — M25561 Pain in right knee: Secondary | ICD-10-CM

## 2024-07-19 MED ORDER — TIZANIDINE HCL 2 MG PO TABS: 2.0000 mg | ORAL_TABLET | Freq: Three times a day (TID) | ORAL | 1 refills | Status: AC | PRN

## 2024-07-19 NOTE — Progress Notes (Unsigned)
 LILLETTE Ileana Collet, PhD, LAT, ATC acting as a scribe for Artist Lloyd, MD.  Sara Arias is a 58 y.o. female who presents to [facility] at [facility] today for exacerbation of her L shoulder and R knee pain. Pt was last seen by Dr. Lloyd on 05/03/24 and was given a R knee steroid injection and was referred to orthopedic surgery (of which she canceled x2 and now-showed on 11/13).  Today, pt reports R knee pain returned when the weather started to cool down. She has been doing some HEP. She notes missing her referral appointment, but has decided against surgery. L shoulder is doing better, improved AROM, but she would like a injection.  Dx imaging: 05/03/24 R knee XR 10/29/22 L shoulder MRI 09/07/22 L shoulder XR  Pertinent review of systems: No fevers or chills  Relevant historical information: Hypertension   Exam:  BP 128/84   Pulse (!) 101   Ht 5' 4 (1.626 m)   Wt 216 lb (98 kg)   SpO2 98%   BMI 37.08 kg/m  General: Well Developed, well nourished, and in no acute distress.   MSK: Right knee mild effusion.  Normal appearing otherwise normal motion.  Tender palpation medial joint line.  Left shoulder normal-appearing decreased range of motion.  Pain with abduction.    Lab and Radiology Results  Procedure: Real-time Ultrasound Guided Injection of right knee joint superior lateral patella space Device: Philips Affiniti 50G/GE Logiq Images permanently stored and available for review in PACS Verbal informed consent obtained.  Discussed risks and benefits of procedure. Warned about infection, bleeding, hyperglycemia damage to structures among others. Patient expresses understanding and agreement Time-out conducted.   Noted no overlying erythema, induration, or other signs of local infection.   Skin prepped in a sterile fashion.   Local anesthesia: Topical Ethyl chloride.   With sterile technique and under real time ultrasound guidance: 40 mg of Kenalog  and 2 mL of  Marcaine  injected into knee joint. Fluid seen entering the joint capsule.   Completed without difficulty   Pain immediately resolved suggesting accurate placement of the medication.   Advised to call if fevers/chills, erythema, induration, drainage, or persistent bleeding.   Images permanently stored and available for review in the ultrasound unit.  Impression: Technically successful ultrasound guided injection.   Procedure: Real-time Ultrasound Guided Injection of left shoulder glenohumeral joint posterior approach Device: Philips Affiniti 50G/GE Logiq Images permanently stored and available for review in PACS Verbal informed consent obtained.  Discussed risks and benefits of procedure. Warned about infection, bleeding, hyperglycemia damage to structures among others. Patient expresses understanding and agreement Time-out conducted.   Noted no overlying erythema, induration, or other signs of local infection.   Skin prepped in a sterile fashion.   Local anesthesia: Topical Ethyl chloride.   With sterile technique and under real time ultrasound guidance: 40 mg of Kenalog  and 2 mL eaters of Marcaine  injected into left shoulder joint. Fluid seen entering the joint capsule.   Completed without difficulty   Pain immediately resolved suggesting accurate placement of the medication.   Advised to call if fevers/chills, erythema, induration, drainage, or persistent bleeding.   Images permanently stored and available for review in the ultrasound unit.  Impression: Technically successful ultrasound guided injection.         Assessment and Plan: 58 y.o. female with right knee and left shoulder pain both due to DJD.  Plan for steroid injection both locations.  She is manage to significantly  improve her symptoms with weight loss and exercise.  She has gone from effectively needing a reverse total shoulder replacement to required only intermittent injections.  This is excellent.  Happy to continue  doing injections as needed.  Plan to recheck in about 3 months.  Tizanidine  refilled.   PDMP not reviewed this encounter. Orders Placed This Encounter  Procedures   US  LIMITED JOINT SPACE STRUCTURES LOW RIGHT(NO LINKED CHARGES)    Reason for Exam (SYMPTOM  OR DIAGNOSIS REQUIRED):   right knee pain    Preferred imaging location?:   [facility] ordered this encounter  Medications   tiZANidine  (ZANAFLEX ) 2 MG tablet    Sig: Take 1-2 tablets (2-4 mg total) by mouth every 8 (eight) hours as needed.    Dispense:  60 tablet    Refill:  1     Discussed warning signs or symptoms. Please see discharge instructions. Patient expresses understanding.   The above documentation has been reviewed and is accurate and complete Artist Lloyd, M.D.

## 2024-07-19 NOTE — Patient Instructions (Signed)
Thank you for coming in today.   You received an injection today. Seek immediate medical attention if the joint becomes red, extremely painful, or is oozing fluid.   Check back in 3 months 

## 2024-08-16 ENCOUNTER — Encounter (HOSPITAL_COMMUNITY): Payer: Self-pay

## 2024-08-16 ENCOUNTER — Inpatient Hospital Stay (HOSPITAL_COMMUNITY): Admission: RE | Admit: 2024-08-16 | Discharge: 2024-08-16 | Attending: Family Medicine

## 2024-08-16 VITALS — BP 150/91 | HR 92 | Temp 97.9°F | Resp 17

## 2024-08-16 DIAGNOSIS — R051 Acute cough: Secondary | ICD-10-CM

## 2024-08-16 DIAGNOSIS — J00 Acute nasopharyngitis [common cold]: Secondary | ICD-10-CM | POA: Diagnosis not present

## 2024-08-16 LAB — POC COVID19/FLU A&B COMBO
Covid Antigen, POC: NEGATIVE
Influenza A Antigen, POC: NEGATIVE
Influenza B Antigen, POC: NEGATIVE

## 2024-08-16 MED ORDER — PROMETHAZINE-DM 6.25-15 MG/5ML PO SYRP: 5.0000 mL | ORAL_SOLUTION | Freq: Four times a day (QID) | ORAL | 0 refills | Status: AC | PRN

## 2024-08-16 NOTE — Discharge Instructions (Signed)
 Results for orders placed or performed during the hospital encounter of 08/16/24  POC Covid19/Flu A&B Antigen   Collection Time: 08/16/24  3:47 PM  Result Value Ref Range   Influenza A Antigen, POC Negative Negative   Influenza B Antigen, POC Negative Negative   Covid Antigen, POC Negative Negative

## 2024-08-16 NOTE — ED Provider Notes (Signed)
 [facility]   [id number] 08/16/24 Arrival Time: 1516  ASSESSMENT & PLAN:  1. Acute cough   2. Common cold    Discussed typical duration of likely viral illness. Results for orders placed or performed during the hospital encounter of 08/16/24  POC Covid19/Flu A&B Antigen   Collection Time: 08/16/24  3:47 PM  Result Value Ref Range   Influenza A Antigen, POC Negative Negative   Influenza B Antigen, POC Negative Negative   Covid Antigen, POC Negative Negative   OTC symptom care as needed.  Meds ordered this encounter  Medications   promethazine -dextromethorphan (PROMETHAZINE -DM) 6.25-15 MG/5ML syrup    Sig: Take 5 mLs by mouth 4 (four) times daily as needed for cough.    Dispense:  118 mL    Refill:  0   Work note provided.  Follow-up Information     Alvia Corean CROME, FNP.   Specialty: Family Medicine Why: As needed. Contact information: [address] [place] [address] [phone]                 Reviewed expectations re: course of current medical issues. Questions answered. Outlined signs and symptoms indicating need for more acute intervention. Understanding verbalized. After Visit Summary given.   SUBJECTIVE: History from: Patient. Sara Arias is a 58 y.o. female. Pt reports since Sunday had cough, congestion  and chills. Reports her heat isnt working and walks to work. 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  Denies: fever. Normal PO intake without n/v/d.  OBJECTIVE:  Vitals:   08/16/24 1537  BP: (!) 150/91  Pulse: 92  Resp: 17  Temp: 97.9 F (36.6 C)  TempSrc: Oral  SpO2: 95%    General appearance: alert; no distress Eyes: PERRLA; EOMI; conjunctiva normal HENT: [place]; AT; with nasal congestion Neck: supple  Lungs: speaks full sentences without difficulty; unlabored; dry cough; CTAB Extremities: no edema Skin: warm and dry Neurologic: normal  gait Psychological: alert and cooperative; normal mood and affect  Labs: Results for orders placed or performed during the hospital encounter of 08/16/24  POC Covid19/Flu A&B Antigen   Collection Time: 08/16/24  3:47 PM  Result Value Ref Range   Influenza A Antigen, POC Negative Negative   Influenza B Antigen, POC Negative Negative   Covid Antigen, POC Negative Negative   Labs Reviewed  POC COVID19/FLU A&B COMBO    Imaging: No results found.  Allergies[1]  Past Medical History:  Diagnosis Date   Knee pain    Obesity    Panic attacks    Social History   Socioeconomic History   Marital status: Divorced    Spouse name: Not on file   Number of children: Not on file   Years of education: Not on file   Highest education level: Not on file  Occupational History   Not on file  Tobacco Use   Smoking status: Former    Types: Cigarettes   Smokeless tobacco: Never  Vaping Use   Vaping status: Never Used  Substance and Sexual Activity   Alcohol use: No   Drug use: No   Sexual activity: Not Currently    Birth control/protection: Surgical  Other Topics Concern   Not on file  Social History Narrative   Not on file   Social Drivers of Health   Tobacco Use: Medium Risk (05/03/2024)   Patient History    Smoking Tobacco Use: Former    Smokeless Tobacco Use:  Never    Passive Exposure: Not on file  Financial Resource Strain: Not on file  Food Insecurity: Not on file  Transportation Needs: Not on file  Physical Activity: Not on file  Stress: Not on file  Social Connections: Not on file  Intimate Partner Violence: Not on file  Depression (PHQ2-9): Low Risk (01/12/2024)   Depression (PHQ2-9)    PHQ-2 Score: 0  Alcohol Screen: Not on file  Housing: Not on file  Utilities: Not on file  Health Literacy: Not on file   Family History  Problem Relation Age of Onset   Osteoarthritis Mother    Hypertension Father    Hypertension Sister    Past Surgical History:   Procedure Laterality Date   PARTIAL HYSTERECTOMY     SHOULDER SURGERY        [1]  Allergies Allergen Reactions   Ibuprofen Swelling    Swelling of the face, per patient naproxyn is ok     Rolinda Rogue, MD 08/16/24 1640

## 2024-08-16 NOTE — ED Triage Notes (Signed)
 Pt reports since Sunday had cough, congestion  and chills. Reports her heat isnt working and walks to work. 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

## 2024-10-02 ENCOUNTER — Other Ambulatory Visit: Payer: Self-pay | Admitting: Family Medicine

## 2024-10-03 NOTE — Telephone Encounter (Signed)
 Pt was switched to tizanidine . Rx refill not appropriate.

## 2024-10-25 ENCOUNTER — Ambulatory Visit: Admitting: Family Medicine
